# Patient Record
Sex: Female | Born: 1970
Health system: Southern US, Community
[De-identification: ages and names within clinical notes are randomized; demographics above are authoritative.]

## PROBLEM LIST (undated history)

## (undated) DIAGNOSIS — F909 Attention-deficit hyperactivity disorder, unspecified type: Secondary | ICD-10-CM

## (undated) DIAGNOSIS — E785 Hyperlipidemia, unspecified: Secondary | ICD-10-CM

## (undated) HISTORY — DX: Hyperlipidemia, unspecified: E78.5

## (undated) HISTORY — DX: Attention-deficit hyperactivity disorder, unspecified type: F90.9

## (undated) HISTORY — PX: OTHER SURGICAL HISTORY: SHX169

## (undated) HISTORY — PX: HAND SURGERY: SHX662

---

## 1999-06-02 ENCOUNTER — Other Ambulatory Visit: Admission: RE | Admit: 1999-06-02 | Discharge: 1999-06-02 | Payer: Self-pay | Admitting: Obstetrics and Gynecology

## 2000-06-22 ENCOUNTER — Other Ambulatory Visit: Admission: RE | Admit: 2000-06-22 | Discharge: 2000-06-22 | Payer: Self-pay | Admitting: Obstetrics and Gynecology

## 2001-07-03 ENCOUNTER — Other Ambulatory Visit: Admission: RE | Admit: 2001-07-03 | Discharge: 2001-07-03 | Payer: Self-pay | Admitting: Gynecology

## 2002-07-11 ENCOUNTER — Other Ambulatory Visit: Admission: RE | Admit: 2002-07-11 | Discharge: 2002-07-11 | Payer: Self-pay | Admitting: Gynecology

## 2003-12-04 ENCOUNTER — Other Ambulatory Visit: Admission: RE | Admit: 2003-12-04 | Discharge: 2003-12-04 | Payer: Self-pay | Admitting: Gynecology

## 2004-12-29 ENCOUNTER — Other Ambulatory Visit: Admission: RE | Admit: 2004-12-29 | Discharge: 2004-12-29 | Payer: Self-pay | Admitting: Gynecology

## 2007-01-05 ENCOUNTER — Other Ambulatory Visit: Admission: RE | Admit: 2007-01-05 | Discharge: 2007-01-05 | Payer: Self-pay | Admitting: Gynecology

## 2007-01-31 ENCOUNTER — Encounter: Admission: RE | Admit: 2007-01-31 | Discharge: 2007-01-31 | Payer: Self-pay | Admitting: Gynecology

## 2009-10-05 ENCOUNTER — Ambulatory Visit: Payer: Self-pay | Admitting: Diagnostic Radiology

## 2009-10-05 ENCOUNTER — Inpatient Hospital Stay (HOSPITAL_COMMUNITY): Admission: EM | Admit: 2009-10-05 | Discharge: 2009-10-07 | Payer: Self-pay | Admitting: Internal Medicine

## 2009-10-05 ENCOUNTER — Encounter: Payer: Self-pay | Admitting: Emergency Medicine

## 2010-07-05 LAB — COMPREHENSIVE METABOLIC PANEL
ALT: 9 U/L (ref 0–35)
AST: 31 U/L (ref 0–37)
Albumin: 2.9 g/dL — ABNORMAL LOW (ref 3.5–5.2)
Alkaline Phosphatase: 46 U/L (ref 39–117)
BUN: 8 mg/dL (ref 6–23)
Calcium: 8.3 mg/dL — ABNORMAL LOW (ref 8.4–10.5)
Calcium: 9.1 mg/dL (ref 8.4–10.5)
Chloride: 106 mEq/L (ref 96–112)
Creatinine, Ser: 0.55 mg/dL (ref 0.4–1.2)
GFR calc Af Amer: >60 mL/min (ref >60)
Glucose, Bld: 89 mg/dL (ref 70–99)
Potassium: 5 mEq/L (ref 3.5–5.1)
Sodium: 137 mEq/L (ref 135–145)
Sodium: 139 mEq/L (ref 135–145)
Total Bilirubin: 0.9 mg/dL (ref 0.3–1.2)
Total Protein: 5.4 g/dL — ABNORMAL LOW (ref 6.0–8.3)

## 2010-07-05 LAB — HEPATIC FUNCTION PANEL
ALT: 10 U/L (ref 0–35)
Albumin: 2.8 g/dL — ABNORMAL LOW (ref 3.5–5.2)
Alkaline Phosphatase: 45 U/L (ref 39–117)
Bilirubin, Direct: 0.1 mg/dL (ref 0.0–0.3)
Indirect Bilirubin: 0.9 mg/dL (ref 0.3–0.9)
Total Bilirubin: 1 mg/dL (ref 0.3–1.2)
Total Protein: 5.3 g/dL — ABNORMAL LOW (ref 6.0–8.3)

## 2010-07-05 LAB — DIFFERENTIAL
Eosinophils Relative: 0 % (ref 0–5)
Lymphs Abs: 1 10*3/uL (ref 0.7–4.0)
Monocytes Absolute: 0.7 10*3/uL (ref 0.1–1.0)
Neutro Abs: 8.6 10*3/uL — ABNORMAL HIGH (ref 1.7–7.7)

## 2010-07-05 LAB — CBC
HCT: 36.2 % (ref 36.0–46.0)
Hemoglobin: 12.4 g/dL (ref 12.0–15.0)
MCHC: 34.3 g/dL (ref 30.0–36.0)
MCV: 93.5 fL (ref 78.0–100.0)
Platelets: 216 10*3/uL (ref 150–400)

## 2010-07-05 LAB — LIPID PANEL
Triglycerides: 155 mg/dL — ABNORMAL HIGH (ref <150)
VLDL: 31 mg/dL (ref 0–40)

## 2010-07-05 LAB — URINALYSIS, ROUTINE W REFLEX MICROSCOPIC
Glucose, UA: NEGATIVE mg/dL
Protein, ur: NEGATIVE mg/dL
Urobilinogen, UA: 0.2 mg/dL (ref 0.0–1.0)
pH: 5.5 (ref 5.0–8.0)

## 2010-07-05 LAB — LIPASE, BLOOD
Lipase: 954 U/L — ABNORMAL HIGH (ref 23–300)
Lipase: 97 U/L — ABNORMAL HIGH (ref 11–59)
Lipase: 99 U/L — ABNORMAL HIGH (ref 11–59)

## 2012-05-03 ENCOUNTER — Other Ambulatory Visit: Payer: Self-pay | Admitting: Gynecology

## 2012-05-03 DIAGNOSIS — Z1231 Encounter for screening mammogram for malignant neoplasm of breast: Secondary | ICD-10-CM

## 2012-05-22 ENCOUNTER — Ambulatory Visit
Admission: RE | Admit: 2012-05-22 | Discharge: 2012-05-22 | Disposition: A | Discharge status: Home / Self Care | Payer: Managed Care, Other (non HMO) | Source: Ambulatory Visit | Attending: Gynecology | Admitting: Gynecology

## 2012-05-22 DIAGNOSIS — Z1231 Encounter for screening mammogram for malignant neoplasm of breast: Secondary | ICD-10-CM

## 2013-05-25 ENCOUNTER — Other Ambulatory Visit: Payer: Self-pay

## 2013-05-25 DIAGNOSIS — Z1231 Encounter for screening mammogram for malignant neoplasm of breast: Secondary | ICD-10-CM

## 2013-06-22 ENCOUNTER — Ambulatory Visit
Admission: RE | Admit: 2013-06-22 | Discharge: 2013-06-22 | Disposition: A | Discharge status: Home / Self Care | Payer: Managed Care, Other (non HMO) | Source: Ambulatory Visit

## 2013-06-22 DIAGNOSIS — Z1231 Encounter for screening mammogram for malignant neoplasm of breast: Secondary | ICD-10-CM

## 2014-07-03 ENCOUNTER — Other Ambulatory Visit: Payer: Self-pay

## 2014-07-03 DIAGNOSIS — Z1231 Encounter for screening mammogram for malignant neoplasm of breast: Secondary | ICD-10-CM

## 2014-07-24 ENCOUNTER — Ambulatory Visit
Admission: RE | Admit: 2014-07-24 | Discharge: 2014-07-24 | Disposition: A | Discharge status: Home / Self Care | Payer: BLUE CROSS/BLUE SHIELD | Source: Ambulatory Visit

## 2014-07-24 DIAGNOSIS — Z1231 Encounter for screening mammogram for malignant neoplasm of breast: Secondary | ICD-10-CM

## 2015-02-03 ENCOUNTER — Emergency Department (HOSPITAL_COMMUNITY): Payer: BLUE CROSS/BLUE SHIELD

## 2015-02-03 ENCOUNTER — Emergency Department (HOSPITAL_BASED_OUTPATIENT_CLINIC_OR_DEPARTMENT_OTHER): Payer: BLUE CROSS/BLUE SHIELD

## 2015-02-03 ENCOUNTER — Inpatient Hospital Stay (HOSPITAL_BASED_OUTPATIENT_CLINIC_OR_DEPARTMENT_OTHER)
Admission: EM | Admit: 2015-02-03 | Discharge: 2015-02-05 | DRG: 201 | Disposition: A | Discharge status: Home / Self Care | Payer: BLUE CROSS/BLUE SHIELD | Attending: Internal Medicine | Admitting: Internal Medicine

## 2015-02-03 ENCOUNTER — Encounter (HOSPITAL_BASED_OUTPATIENT_CLINIC_OR_DEPARTMENT_OTHER): Payer: Self-pay

## 2015-02-03 DIAGNOSIS — J939 Pneumothorax, unspecified: Secondary | ICD-10-CM

## 2015-02-03 DIAGNOSIS — Z720 Tobacco use: Secondary | ICD-10-CM | POA: Diagnosis not present

## 2015-02-03 DIAGNOSIS — J982 Interstitial emphysema: Secondary | ICD-10-CM | POA: Diagnosis present

## 2015-02-03 DIAGNOSIS — J9311 Primary spontaneous pneumothorax: Secondary | ICD-10-CM

## 2015-02-03 DIAGNOSIS — Z9689 Presence of other specified functional implants: Secondary | ICD-10-CM

## 2015-02-03 DIAGNOSIS — J9383 Other pneumothorax: Principal | ICD-10-CM | POA: Diagnosis present

## 2015-02-03 DIAGNOSIS — R110 Nausea: Secondary | ICD-10-CM | POA: Diagnosis present

## 2015-02-03 DIAGNOSIS — D649 Anemia, unspecified: Secondary | ICD-10-CM | POA: Diagnosis present

## 2015-02-03 LAB — URINALYSIS, ROUTINE W REFLEX MICROSCOPIC
BILIRUBIN URINE: NEGATIVE
GLUCOSE, UA: NEGATIVE mg/dL
HGB URINE DIPSTICK: NEGATIVE
Ketones, ur: NEGATIVE mg/dL
Leukocytes, UA: NEGATIVE
Nitrite: NEGATIVE
Protein, ur: NEGATIVE mg/dL
SPECIFIC GRAVITY, URINE: 1.027 (ref 1.005–1.030)
UROBILINOGEN UA: 0.2 mg/dL (ref 0.0–1.0)
pH: 6 (ref 5.0–8.0)

## 2015-02-03 LAB — PREGNANCY, URINE: PREG TEST UR: NEGATIVE

## 2015-02-03 LAB — DIFFERENTIAL
BASOS ABS: 0 10*3/uL (ref 0.0–0.1)
BASOS PCT: 0 %
EOS ABS: 0.4 10*3/uL (ref 0.0–0.7)
Eosinophils Relative: 6 %
Lymphocytes Relative: 31 %
Lymphs Abs: 2.2 10*3/uL (ref 0.7–4.0)
Monocytes Absolute: 0.6 10*3/uL (ref 0.1–1.0)
Monocytes Relative: 9 %
NEUTROS ABS: 3.9 10*3/uL (ref 1.7–7.7)
NEUTROS PCT: 54 %

## 2015-02-03 LAB — COMPREHENSIVE METABOLIC PANEL
ALK PHOS: 42 U/L (ref 38–126)
ALT: 15 U/L (ref 14–54)
AST: 19 U/L (ref 15–41)
Albumin: 4.3 g/dL (ref 3.5–5.0)
Anion gap: 3 — ABNORMAL LOW (ref 5–15)
BILIRUBIN TOTAL: 0.4 mg/dL (ref 0.3–1.2)
BUN: 15 mg/dL (ref 6–20)
CALCIUM: 9.1 mg/dL (ref 8.9–10.3)
CO2: 28 mmol/L (ref 22–32)
CREATININE: 0.66 mg/dL (ref 0.44–1.00)
Chloride: 107 mmol/L (ref 101–111)
GFR calc Af Amer: >60 mL/min (ref >60)
Glucose, Bld: 97 mg/dL (ref 65–99)
POTASSIUM: 4.7 mmol/L (ref 3.5–5.1)
Sodium: 138 mmol/L (ref 135–145)
TOTAL PROTEIN: 7.4 g/dL (ref 6.5–8.1)

## 2015-02-03 LAB — LIPASE, BLOOD: Lipase: 23 U/L (ref 22–51)

## 2015-02-03 LAB — CBC
HCT: 35.4 % — ABNORMAL LOW (ref 36.0–46.0)
Hemoglobin: 11.5 g/dL — ABNORMAL LOW (ref 12.0–15.0)
MCH: 28 pg (ref 26.0–34.0)
MCHC: 32.5 g/dL (ref 30.0–36.0)
MCV: 86.3 fL (ref 78.0–100.0)
PLATELETS: 247 10*3/uL (ref 150–400)
RBC: 4.1 MIL/uL (ref 3.87–5.11)
RDW: 13.5 % (ref 11.5–15.5)
WBC: 7.1 10*3/uL (ref 4.0–10.5)

## 2015-02-03 MED ORDER — MIDAZOLAM HCL 2 MG/2ML IJ SOLN
1.0000 mg | Freq: Once | INTRAMUSCULAR | Status: DC
  Filled 2015-02-03: qty 2

## 2015-02-03 MED ORDER — MORPHINE SULFATE (PF) 4 MG/ML IV SOLN
4.0000 mg | Freq: Once | INTRAVENOUS | Status: AC
  Administered 2015-02-03: 4 mg via INTRAVENOUS
  Filled 2015-02-03: qty 1

## 2015-02-03 MED ORDER — HYDROMORPHONE HCL 1 MG/ML IJ SOLN
0.5000 mg | Freq: Once | INTRAMUSCULAR | Status: AC
  Administered 2015-02-03: 0.5 mg via INTRAVENOUS
  Filled 2015-02-03: qty 1

## 2015-02-03 MED ORDER — HYDROMORPHONE HCL 1 MG/ML IJ SOLN
INTRAMUSCULAR | Status: AC
  Filled 2015-02-03: qty 1

## 2015-02-03 MED ORDER — HYDROMORPHONE HCL 1 MG/ML IJ SOLN: 1.0000 mg | Freq: Once | INTRAMUSCULAR | Status: DC

## 2015-02-03 MED ORDER — HYDROMORPHONE HCL 1 MG/ML IJ SOLN
1.0000 mg | Freq: Once | INTRAMUSCULAR | Status: AC
  Administered 2015-02-03: 1 mg via INTRAVENOUS
  Filled 2015-02-03: qty 1

## 2015-02-03 MED ORDER — SODIUM CHLORIDE 0.9 % IV BOLUS (SEPSIS)
1000.0000 mL | Freq: Once | INTRAVENOUS | Status: AC
  Administered 2015-02-03: 1000 mL via INTRAVENOUS

## 2015-02-03 MED ORDER — HEPARIN SODIUM (PORCINE) 5000 UNIT/ML IJ SOLN
5000.0000 [IU] | Freq: Three times a day (TID) | INTRAMUSCULAR | Status: DC
  Administered 2015-02-04 – 2015-02-05 (×6): 5000 [IU] via SUBCUTANEOUS
  Filled 2015-02-03 (×6): qty 1

## 2015-02-03 MED ORDER — LIDOCAINE-EPINEPHRINE (PF) 2 %-1:200000 IJ SOLN
10.0000 mL | Freq: Once | INTRAMUSCULAR | Status: AC
  Administered 2015-02-03: 10 mL via INTRADERMAL
  Filled 2015-02-03: qty 20

## 2015-02-03 MED ORDER — MIDAZOLAM HCL 2 MG/2ML IJ SOLN
1.0000 mg | Freq: Once | INTRAMUSCULAR | Status: AC
  Administered 2015-02-03: 1 mg via INTRAVENOUS

## 2015-02-03 MED ORDER — NICOTINE 21 MG/24HR TD PT24
21.0000 mg | MEDICATED_PATCH | Freq: Once | TRANSDERMAL | Status: AC
  Administered 2015-02-03: 21 mg via TRANSDERMAL
  Filled 2015-02-03: qty 1

## 2015-02-03 MED ORDER — HYDROMORPHONE HCL 1 MG/ML IJ SOLN
0.5000 mg | Freq: Once | INTRAMUSCULAR | Status: AC
Start: 2015-02-03 — End: 2015-02-03
  Administered 2015-02-03: 0.5 mg via INTRAVENOUS
  Filled 2015-02-03: qty 1

## 2015-02-03 MED ORDER — SODIUM CHLORIDE 0.9 % IV SOLN: 1000.0000 mL | INTRAVENOUS | Status: DC

## 2015-02-03 MED ORDER — MIDAZOLAM HCL 2 MG/2ML IJ SOLN
INTRAMUSCULAR | Status: AC
  Filled 2015-02-03: qty 2

## 2015-02-03 MED ORDER — HYDROMORPHONE HCL 1 MG/ML IJ SOLN
1.0000 mg | Freq: Once | INTRAMUSCULAR | Status: AC
  Administered 2015-02-03: 1 mg via INTRAVENOUS

## 2015-02-03 MED ORDER — SODIUM CHLORIDE 0.9 % IV SOLN
1000.0000 mL | Freq: Once | INTRAVENOUS | Status: AC
  Administered 2015-02-03: 1000 mL via INTRAVENOUS

## 2015-02-03 MED ORDER — HYDROCODONE-ACETAMINOPHEN 5-325 MG PO TABS
1.0000 | ORAL_TABLET | ORAL | Status: DC | PRN
  Administered 2015-02-04: 2 via ORAL
  Filled 2015-02-03: qty 2

## 2015-02-03 NOTE — Consult Note (Signed)
[address]       ,[place] [address]             [phone]        Natalie Jones [place] Medical Record #[id number] Date of Birth: 12-11-1970  Referring: No ref. provider found Primary Care: No PCP Per Patient  Chief Complaint:    Chief Complaint  Patient presents with  . Abdominal Pain    History of Present Illness:      Patient is  is a 44 y.o. female who presents to the Emergency Department complaining of 3 days of  , constant LUQ abdominal pain that radiates to left flank region.. Today, she reports the pain has returned. Pt additionally reports the pain to radiate to her bilateral upper back. Bending over exacerbates her pain. She has alternated the application of ice and a heating pad with some relief. Her pain has been unrelieved by ibuprofen. She has a PMhx of abdominal pain s/p EtOH consumption that she was admitted for but pt denies her current pain to have a similar onset or similar qualities. Pt denies nausea, vomiting, constipation, diarrhea, fevers, chills or diaphoresis Pt is long term smoker. Chest xray showed apical 20 ptx. While I was in OR,  ER MD placed pigtail left chest but xray suspicious the it is in muscle and not intra thoracic.  Current Activity/ Functional Status: Patient is independent with mobility/ambulation, transfers, ADL's, IADL's.   Zubrod Score: At the time of surgery this patient's most appropriate activity status/level should be described as: []     0    Normal activity, no symptoms [x]     1    Restricted in physical strenuous activity but ambulatory, able to do out light work []     2    Ambulatory and capable of self care, unable to do work activities, up and about                 more than 50%  Of the time                            []     3    Only limited self care, in bed greater than 50% of waking hours []     4    Completely disabled, no self care, confined to bed or chair []     5    Moribund  History reviewed. No  pertinent past medical history.  Past Surgical History  Procedure Laterality Date  . Hand surgery      History  Smoking status  . Never Smoker   Smokeless tobacco  . Not on file   History  Alcohol Use  . Yes    Comment: occ    Social History   Social History  . Marital Status: Single    Spouse Name: N/A  . Number of Children: N/A  . Years of Education: N/A   Occupational History  . Not on file.   Social History Main Topics  . Smoking status: Never Smoker   . Smokeless tobacco: Not on file  . Alcohol Use: Yes     Comment: occ  . Drug Use: No  . Sexual Activity: Not on file   Other Topics Concern  . Not on file   Social History Narrative  . No narrative on file    No Known Allergies  Current Facility-Administered Medications  Medication Dose Route Frequency  Provider Last Rate Last Dose  . midazolam (VERSED) injection 1 mg  1 mg Intravenous Once Roberto Scales, MD      . nicotine (NICODERM CQ - dosed in mg/24 hours) patch 21 mg  21 mg Transdermal Once Roberto Scales, MD   21 mg at 02/03/15 2123   Current Outpatient Prescriptions  Medication Sig Dispense Refill  . ibuprofen (ADVIL,MOTRIN) 800 MG tablet Take 800 mg by mouth daily as needed for moderate pain.        (Not in a hospital admission)  No family history on file.   Review of Systems:      Cardiac Review of Systems: Y or N  Chest Pain [  left  ]  Resting SOB [ n  ] Exertional SOB  [ y ]  Orthopnea [ n ]   Pedal Edema [ n  ]    Palpitations [ n ] Syncope  n[  ]   Presyncope [ n  ]  General Review of Systems: [Y] = yes [  ]=no Constitional: recent weight change [  ]; anorexia [  ]; fatigue [  ]; nausea [  ]; night sweats [  ]; fever [  ]; or chills [  ]                                                               Dental: poor dentition[  ]; Last Dentist visit:   Eye : blurred vision [  ]; diplopia [   ]; vision changes [  ];  Amaurosis fugax[  ]; Resp: cough [  ];  wheezing[  ];  hemoptysis[   ]; shortness of breath[  ]; paroxysmal nocturnal dyspnea[  ]; dyspnea on exertion[  ]; or orthopnea[  ];  GI:  gallstones[  ], vomiting[  ];  dysphagia[  ]; melena[  ];  hematochezia [  ]; heartburn[  ];   Hx of  Colonoscopy[  ]; GU: kidney stones [  ]; hematuria[  ];   dysuria [  ];  nocturia[  ];  history of     obstruction [  ]; urinary frequency [  ]             Skin: rash, swelling[  ];, hair loss[  ];  peripheral edema[  ];  or itching[  ]; Musculosketetal: myalgias[  ];  joint swelling[  ];  joint erythema[  ];  joint pain[  ];  back pain[  ];  Heme/Lymph: bruising[  ];  bleeding[  ];  anemia[  ];  Neuro: TIA[  ];  headaches[  ];  stroke[  ];  vertigo[  ];  seizures[  ];   paresthesias[  ];  difficulty walking[  ];  Psych:depression[  ]; anxiety[  ];  Endocrine: diabetes[  ];  thyroid dysfunction[  ];  Immunizations: Flu [  ]; Pneumococcal[  ];  Other:  Physical Exam: BP 110/58 mmHg  Pulse 88  Temp(Src) 98.5 F (36.9 C) (Oral)  Resp 16  Ht 5\' 2"  (1.575 m)  Wt 102 lb (46.267 kg)  BMI 18.65 kg/m2  SpO2 100%  LMP 01/18/2015   General appearance: alert, cooperative and no distress Head: Normocephalic, without obvious abnormality, atraumatic Neck: no adenopathy, no carotid bruit, no JVD, supple, symmetrical, trachea  midline and thyroid not enlarged, symmetric, no tenderness/mass/nodules Lymph nodes: Cervical, supraclavicular, and axillary nodes normal. Resp: diminished breath sounds LUL Back: symmetric, no curvature. ROM normal. No CVA tenderness. Cardio: regular rate and rhythm, S1, S2 normal, no murmur, click, rub or gallop GI: soft, non-tender; bowel sounds normal; no masses,  no organomegaly Extremities: extremities normal, atraumatic, no cyanosis or edema and Homans sign is negative, no sign of DVT Neurologic: Grossly normal  Diagnostic Studies & Laboratory data:     Recent Radiology Findings:   Dg Chest 2 View  02/03/2015  CLINICAL DATA:  Left abdominal and upper  back pain. EXAM: CHEST  2 VIEW COMPARISON:  02/21/2006 chest radiograph FINDINGS: There is a moderate to large left pneumothorax. No right pneumothorax. No pleural effusion. There is minimal right right mediastinal shift. Normal heart size. Normal mediastinal contour. Lungs appear clear, with no pulmonary edema. No displaced fracture is seen in the chest. IMPRESSION: Moderate to large left pneumothorax with minimal right mediastinal shift, suggesting tension pneumothorax. Lungs appear clear. Critical Value/emergent results were called by telephone at the time of interpretation on 02/03/2015 at 4:32 pm to Dr. Delora Fuel , who verbally acknowledged these results. Electronically Signed   By: Ilona Sorrel M.D.   On: 02/03/2015 16:34   Dg Chest Portable 1 View  02/03/2015  CLINICAL DATA:  Left pneumothorax post left chest tube placement , query intrathoracic location of the catheter. EXAM: PORTABLE CHEST 1 VIEW COMPARISON:  Earlier this day at 2238 hour FINDINGS: Portable semi upright oblique view of the thorax demonstrates left-sided catheter projecting over the left hemithorax, however chest wall versus intrathoracic location could not be determined. The left pneumothorax persists. IMPRESSION: Pigtail catheter in the left hemithorax, however intrathoracic placement cannot be confirmed. Recommend repositioning. Persistent left pneumothorax. These results were discussed via telephone at the time of interpretation on 02/03/2015 at 11:00 pm with Dr. Debby Freiberg , who verbally acknowledged these results. Electronically Signed   By: Jeb Levering M.D.   On: 02/03/2015 23:04   Dg Chest Portable 1 View  02/03/2015  CLINICAL DATA:  Left pneumothorax, post left chest tube insertion. EXAM: PORTABLE CHEST 1 VIEW COMPARISON:  Pre chest tube exam earlier this day at 1623 hour FINDINGS: Placement of left-sided pigtail catheter, tip appears coiled projecting over the lateral left fifth rib. Left pneumothorax persists,  with equivocal decrease in size. No mediastinal shift. The right lung is clear. Minimal atelectasis at the medial left lung base. No edema. IMPRESSION: Placement of left chest pigtail catheter, tip coiled over the lateral hemithorax projecting over the lateral ribs. Persistent left pneumothorax with equivocal decreased in size. It is unclear whether the catheter is intrathoracic or in the soft tissues, and additional oblique views have been performed and are pending at this time. These results were discussed by telephone at the time of the exam on 02/03/2015 at approximately 10:50 pm with Dr. Debby Freiberg , who verbally acknowledged these results. Electronically Signed   By: Jeb Levering M.D.   On: 02/03/2015 22:57     I have independently reviewed the above radiologic studies.  Recent Lab Findings: Lab Results  Component Value Date   WBC 7.1 02/03/2015   HGB 11.5* 02/03/2015   HCT 35.4* 02/03/2015   PLT 247 02/03/2015   GLUCOSE 97 02/03/2015   CHOL  10/06/2009    133        ATP III CLASSIFICATION:  <200     mg/dL   Desirable  200-239  mg/dL   Borderline High  >=240    mg/dL   High          TRIG 155* 10/06/2009   HDL 46 10/06/2009   LDLCALC  10/06/2009    56        Total Cholesterol/HDL:CHD Risk Coronary Heart Disease Risk Table                     Men   Women  1/2 Average Risk   3.4   3.3  Average Risk       5.0   4.4  2 X Average Risk   9.6   7.1  3 X Average Risk  23.4   11.0        Use the calculated Patient Ratio above and the CHD Risk Table to determine the patient's CHD Risk.        ATP III CLASSIFICATION (LDL):  <100     mg/dL   Optimal  100-129  mg/dL   Near or Above                    Optimal  130-159  mg/dL   Borderline  160-189  mg/dL   High  >190     mg/dL   Very High   ALT 15 02/03/2015   AST 19 02/03/2015   NA 138 02/03/2015   K 4.7 02/03/2015   CL 107 02/03/2015   CREATININE 0.66 02/03/2015   BUN 15 02/03/2015   CO2 28 02/03/2015       Assessment / Plan:     Spontaneous ptx left with several days of abdominal , flank pain. After reviewing the films I have recommended removal of pig tail which is not functional. Follow up xray in am, if remains stable may treat without chest tube.     Grace Isaac MD      [address] [place] [address] Office [phone]   Beeper [phone]  02/03/2015 11:44 PM

## 2015-02-03 NOTE — ED Notes (Signed)
Pt care transferred to [facility] nurses at bedside. Pt requests pain medication, rates her flank pain at 6/10. Pt is awake and alert, talking to her family on cell phone, aware of pending transport to [place] for eval of pneumo.

## 2015-02-03 NOTE — ED Notes (Signed)
MD at bedside. 

## 2015-02-03 NOTE — ED Provider Notes (Addendum)
CSN: [id number]     Arrival date & time 02/03/15  1320 History  By signing my name below, I, Meriel Pica, attest that this documentation has been prepared under the direction and in the presence of Delora Fuel, MD. Electronically Signed: Meriel Pica, ED Scribe. 02/03/2015. 3:39 PM.   Chief Complaint  Patient presents with  . Abdominal Pain   The history is provided by the patient. No language interpreter was used.   HPI Comments: Natalie Jones is a 44 y.o. female who presents to the Emergency Department complaining of sudden onset, constant LUQ abdominal pain that radiates to left flank region. Pt reports the pain originally began 2 days ago after having a BM but subsided on Sunday. Today, she reports the pain has returned and is a constant, 8/10, throbbing pain. Pt additionally reports the pain to radiate to her bilateral upper back. Bending over exacerbates her pain. She has alternated the application of ice and a heating pad with some relief. Her pain has been unrelieved by ibuprofen. She has a PMhx of abdominal pain s/p EtOH consumption that she was admitted for but pt denies her current pain to have a similar onset or similar qualities. Pt denies nausea, vomiting, constipation, diarrhea, fevers, chills or diaphoresis.   History reviewed. No pertinent past medical history. Past Surgical History  Procedure Laterality Date  . Hand surgery     No family history on file. Social History  Substance Use Topics  . Smoking status: Never Smoker   . Smokeless tobacco: None  . Alcohol Use: Yes     Comment: occ   OB History    No data available     Review of Systems  Constitutional: Negative for fever, chills and diaphoresis.  Gastrointestinal: Positive for abdominal pain ( LUQ). Negative for nausea, vomiting, diarrhea and constipation.  Genitourinary: Positive for flank pain ( left).  Musculoskeletal: Positive for back pain ( bilateral upper back).  All other systems reviewed and are  negative.  Allergies  Review of patient's allergies indicates no known allergies.  Home Medications   Prior to Admission medications   Medication Sig Start Date End Date Taking? Authorizing Provider  Amphetamine-Dextroamphetamine (ADDERALL PO) Take by mouth.   Yes Historical Provider, MD   BP 116/51 mmHg  Pulse 103  Temp(Src) 98.5 F (36.9 C) (Oral)  Resp 18  Ht 5\' 2"  (1.575 m)  Wt 102 lb (46.267 kg)  BMI 18.65 kg/m2  SpO2 99%  LMP 01/18/2015 Physical Exam  Constitutional: She is oriented to person, place, and time. She appears well-developed and well-nourished.  HENT:  Head: Normocephalic and atraumatic.  Eyes: EOM are normal. Pupils are equal, round, and reactive to light.  Neck: Normal range of motion. Neck supple. No JVD present.  Cardiovascular: Normal rate, regular rhythm and normal heart sounds.   No murmur heard. Pulmonary/Chest: Effort normal and breath sounds normal. She has no wheezes. She has no rales.  Abdominal: Soft. Bowel sounds are normal. She exhibits no distension and no mass. There is tenderness. There is no rebound and no guarding.  Mild tenderness to epigastrium and LUQ , mild CVA tenderness, no rebound no guarding.   Musculoskeletal: Normal range of motion. She exhibits no edema.  Lymphadenopathy:    She has no cervical adenopathy.  Neurological: She is alert and oriented to person, place, and time. No cranial nerve deficit. She exhibits normal muscle tone. Coordination normal.  Skin: Skin is warm and dry. No rash noted.  Psychiatric: She has a  normal mood and affect. Her behavior is normal. Judgment and thought content normal.  Nursing note and vitals reviewed.   ED Course  Procedures  DIAGNOSTIC STUDIES: Oxygen Saturation is 99% on RA, normal by my interpretation.    COORDINATION OF CARE: 3:51 PM Discussed treatment plan with pt at bedside and pt agreed to plan. Will order diagnostic imaging and labs.    Labs Review Results for orders placed  or performed during the hospital encounter of 02/03/15  Urinalysis, Routine w reflex microscopic (not at [facility])  Result Value Ref Range   Color, Urine YELLOW YELLOW   APPearance CLOUDY (A) CLEAR   Specific Gravity, Urine 1.027 1.005 - 1.030   pH 6.0 5.0 - 8.0   Glucose, UA NEGATIVE NEGATIVE mg/dL   Hgb urine dipstick NEGATIVE NEGATIVE   Bilirubin Urine NEGATIVE NEGATIVE   Ketones, ur NEGATIVE NEGATIVE mg/dL   Protein, ur NEGATIVE NEGATIVE mg/dL   Urobilinogen, UA 0.2 0.0 - 1.0 mg/dL   Nitrite NEGATIVE NEGATIVE   Leukocytes, UA NEGATIVE NEGATIVE  Pregnancy, urine  Result Value Ref Range   Preg Test, Ur NEGATIVE NEGATIVE  Lipase, blood  Result Value Ref Range   Lipase 23 22 - 51 U/L  Comprehensive metabolic panel  Result Value Ref Range   Sodium 138 135 - 145 mmol/L   Potassium 4.7 3.5 - 5.1 mmol/L   Chloride 107 101 - 111 mmol/L   CO2 28 22 - 32 mmol/L   Glucose, Bld 97 65 - 99 mg/dL   BUN 15 6 - 20 mg/dL   Creatinine, Ser 0.66 0.44 - 1.00 mg/dL   Calcium 9.1 8.9 - 10.3 mg/dL   Total Protein 7.4 6.5 - 8.1 g/dL   Albumin 4.3 3.5 - 5.0 g/dL   AST 19 15 - 41 U/L   ALT 15 14 - 54 U/L   Alkaline Phosphatase 42 38 - 126 U/L   Total Bilirubin 0.4 0.3 - 1.2 mg/dL   GFR calc non Af Amer >60 >60 mL/min   GFR calc Af Amer >60 >60 mL/min   Anion gap 3 (L) 5 - 15  CBC  Result Value Ref Range   WBC 7.1 4.0 - 10.5 K/uL   RBC 4.10 3.87 - 5.11 MIL/uL   Hemoglobin 11.5 (L) 12.0 - 15.0 g/dL   HCT 35.4 (L) 36.0 - 46.0 %   MCV 86.3 78.0 - 100.0 fL   MCH 28.0 26.0 - 34.0 pg   MCHC 32.5 30.0 - 36.0 g/dL   RDW 13.5 11.5 - 15.5 %   Platelets 247 150 - 400 K/uL  Differential  Result Value Ref Range   Neutrophils Relative % 54 %   Neutro Abs 3.9 1.7 - 7.7 K/uL   Lymphocytes Relative 31 %   Lymphs Abs 2.2 0.7 - 4.0 K/uL   Monocytes Relative 9 %   Monocytes Absolute 0.6 0.1 - 1.0 K/uL   Eosinophils Relative 6 %   Eosinophils Absolute 0.4 0.0 - 0.7 K/uL   Basophils Relative 0 %    Basophils Absolute 0.0 0.0 - 0.1 K/uL   Imaging Review Dg Chest 2 View  02/03/2015  CLINICAL DATA:  Left abdominal and upper back pain. EXAM: CHEST  2 VIEW COMPARISON:  02/21/2006 chest radiograph FINDINGS: There is a moderate to large left pneumothorax. No right pneumothorax. No pleural effusion. There is minimal right right mediastinal shift. Normal heart size. Normal mediastinal contour. Lungs appear clear, with no pulmonary edema. No displaced fracture is seen in the  chest. IMPRESSION: Moderate to large left pneumothorax with minimal right mediastinal shift, suggesting tension pneumothorax. Lungs appear clear. Critical Value/emergent results were called by telephone at the time of interpretation on 02/03/2015 at 4:32 pm to Dr. Delora Fuel , who verbally acknowledged these results. Electronically Signed   By: Ilona Sorrel M.D.   On: 02/03/2015 16:34   I have personally reviewed and evaluated these images and lab results as part of my medical decision-making.   MDM   Final diagnoses:  Spontaneous pneumothorax  Normochromic normocytic anemia    Abdominal pain of uncertain cause. Old records are reviewed and she had been admitted for pancreatitis 5 years ago. That was after drinking a large amount of active. Patient denies any recent alcohol ingestion. CT of abdomen and pelvis was ordered. She is complaining of pain going up into her interscapular area, so chest x-ray was ordered as well. Patient refused the CT scan but chest x-ray did show a spontaneous pneumothorax. This was discussed with radiologist who was concerned that there was perhaps some slight mid mediastinal shift. I have reviewed the images and do not feel that she shows any signs of tension pneumothorax. It is only approximately 40-50% pneumothorax on the left and blood pressure is normal as his heart rate. Consultation was obtained with Dr. Pia Mau of the thoracic surgery service who states he had just scrubbed in on an emergency  procedure and requested the patient be sent to [facility] ED, and he would see the patient is in his E was completed with the procedure. Case is discussed with Dr. Colin Rhein, ED physician at [facility] ED who agrees to accept the patient in transfer.  I, Carless Slatten, personally performed the services described in this documentation. All medical record entries made by the scribe were at my direction and in my presence.  I have reviewed the chart and discharge instructions and agree that the record reflects my personal performance and is accurate and complete. Eloise Picone.  38/88/2800. 4:48 PM.      Delora Fuel, MD 34/91/79 1505  Makalia Bare, MD 69/79/48 0165

## 2015-02-03 NOTE — ED Provider Notes (Signed)
  Physical Exam  BP 110/58 mmHg  Pulse 88  Temp(Src) 98.5 F (36.9 C) (Oral)  Resp 16  Ht 5\' 2"  (1.575 m)  Wt 102 lb (46.267 kg)  BMI 18.65 kg/m2  SpO2 100%  LMP 01/18/2015  Physical Exam  Constitutional: She is oriented to person, place, and time. She appears well-developed and well-nourished.  HENT:  Head: Normocephalic and atraumatic.  Right Ear: External ear normal.  Left Ear: External ear normal.  Eyes: Conjunctivae and EOM are normal. Pupils are equal, round, and reactive to light.  Neck: Normal range of motion. Neck supple.  Cardiovascular: Normal rate, regular rhythm, normal heart sounds and intact distal pulses.   Pulmonary/Chest: Effort normal. She has decreased breath sounds (L lung, all fields).  Abdominal: Soft. Bowel sounds are normal. There is no tenderness.  Musculoskeletal: Normal range of motion.  Neurological: She is alert and oriented to person, place, and time.  Skin: Skin is warm and dry.  Vitals reviewed.   ED Course  Procedures  MDM 44 y.o. female without pertinent PMH presents with L sided chest pain, spontaneous.  Seen at [facility], found to have moderate to large PTX.  On my assumption of care, exam and vitals as above.  Spoke with CT surgery who recommended we place chest tube here.  This was attempted unsuccessfully, with placement in chest wall.  CT surgery coincidentally came to bedside, examined XR, and do not feel that pt warrants chest tube at this time, so unsuccessful chest tube was removed with plan to repeat xr in am.  CTS requested medical admission.  Admitted in stable condition  I have reviewed all laboratory and imaging studies if ordered as above  1. Spontaneous pneumothorax   2. Normochromic normocytic anemia   3. Pneumothorax   4. Chest tube in place           Debby Freiberg, MD 02/04/15 [phone]

## 2015-02-03 NOTE — ED Notes (Signed)
[facility] at bedside 

## 2015-02-03 NOTE — ED Notes (Signed)
Pt. Arrived from [place] for Thoracic Surgery. Pt. Stable no sob noted. Oxygen level 100% on NRB 100%

## 2015-02-03 NOTE — ED Notes (Addendum)
Pt c/o pain to left abd, rib area, upper back after voiding on Saturday-no pain Sunday-pain returned today-points to left flank for pain c/o at this time-pain worse with movement

## 2015-02-04 ENCOUNTER — Encounter (HOSPITAL_COMMUNITY): Payer: Self-pay | Admitting: *Deleted

## 2015-02-04 ENCOUNTER — Inpatient Hospital Stay (HOSPITAL_COMMUNITY): Payer: BLUE CROSS/BLUE SHIELD

## 2015-02-04 MED ORDER — METOCLOPRAMIDE HCL 5 MG/ML IJ SOLN
10.0000 mg | Freq: Four times a day (QID) | INTRAMUSCULAR | Status: DC
  Administered 2015-02-04 – 2015-02-05 (×5): 10 mg via INTRAVENOUS
  Filled 2015-02-04 (×5): qty 2

## 2015-02-04 MED ORDER — AMPHETAMINE-DEXTROAMPHETAMINE 10 MG PO TABS
20.0000 mg | ORAL_TABLET | ORAL | Status: DC
  Administered 2015-02-04 – 2015-02-05 (×3): 20 mg via ORAL
  Filled 2015-02-04 (×3): qty 2

## 2015-02-04 MED ORDER — OXYCODONE HCL 5 MG PO TABS
5.0000 mg | ORAL_TABLET | ORAL | Status: DC | PRN
  Administered 2015-02-04: 5 mg via ORAL
  Administered 2015-02-04: 10 mg via ORAL
  Administered 2015-02-04: 5 mg via ORAL
  Administered 2015-02-05: 10 mg via ORAL
  Filled 2015-02-04: qty 1
  Filled 2015-02-04: qty 2
  Filled 2015-02-04: qty 1
  Filled 2015-02-04: qty 2

## 2015-02-04 MED ORDER — ONDANSETRON HCL 4 MG/2ML IJ SOLN
4.0000 mg | Freq: Four times a day (QID) | INTRAMUSCULAR | Status: DC | PRN
  Administered 2015-02-04 (×2): 4 mg via INTRAVENOUS
  Filled 2015-02-04 (×2): qty 2

## 2015-02-04 MED ORDER — NICOTINE 21 MG/24HR TD PT24
21.0000 mg | MEDICATED_PATCH | Freq: Every day | TRANSDERMAL | Status: DC
  Administered 2015-02-04 – 2015-02-05 (×2): 21 mg via TRANSDERMAL
  Filled 2015-02-04 (×3): qty 1

## 2015-02-04 NOTE — Progress Notes (Addendum)
      [address]       [place] [address]             [phone]      Subjective:  Ms. Azucena Kuba complains at pain at her left chest.  She states the pain medication is not helping.  She is also experiencing nausea.  Objective: Vital signs in last 24 hours: Temp:  [97.7 F (36.5 C)-98.5 F (36.9 C)] 97.7 F (36.5 C) (10/18 0113) Pulse Rate:  [69-103] 88 (10/18 0113) Cardiac Rhythm:  [-] Normal sinus rhythm (10/18 0845) Resp:  [15-26] 18 (10/18 0113) BP: (99-118)/(51-69) 99/52 mmHg (10/18 0113) SpO2:  [97 %-100 %] 97 % (10/18 0113) FiO2 (%):  [100 %] 100 % (10/17 1846) Weight:  [102 lb (46.267 kg)-102 lb 4.8 oz (46.403 kg)] 102 lb 4.8 oz (46.403 kg) (10/18 0113)  Intake/Output from previous day: 10/17 0701 - 10/18 0700 In: -  Out: 300 [Urine:300] Intake/Output this shift: Total I/O In: 5 [P.O.:5] Out: -   General appearance: alert, cooperative and no distress Heart: regular rate and rhythm Lungs: clear to auscultation bilaterally Wound: clean, some crackling along chest tube site  Lab Results:  Recent Labs  02/03/15 1600  WBC 7.1  HGB 11.5*  HCT 35.4*  PLT 247   BMET:  Recent Labs  02/03/15 1600  NA 138  K 4.7  CL 107  CO2 28  GLUCOSE 97  BUN 15  CREATININE 0.66  CALCIUM 9.1    PT/INR: No results for input(s): LABPROT, INR in the last 72 hours. ABG No results found for: PHART, HCO3, TCO2, ACIDBASEDEF, O2SAT CBG (last 3)  No results for input(s): GLUCAP in the last 72 hours.  Assessment/Plan:  1. Pneumothorax- improved after removal of pigtail catheter.  There is a small area of sub cutaneous air around chest tube site 2. Pain control- Hydrocodone not providing much relief, will d/c and try Oxy IR  3. Nausea- on zofran, will add reglan for additional relief 4. Dispo- will repeat CXR in AM, if remains stable can possibly d/c patient home tomorrow   LOS: 1 day    BARRETT, ERIN 02/04/2015  Chest xray improved today I have  seen and examined Aldene Saric and agree with the above assessment  and plan.  Grace Isaac MD Beeper [phone] Office [phone] 02/04/2015 12:22 PM

## 2015-02-04 NOTE — H&P (Signed)
[facility] History and Physical  Natalie Jones HTD:[id number] DOB: [birth year] DOA: 02/03/2015  Referring physician: EDP PCP: No PCP Per Patient   Chief Complaint: Abdominal pain   HPI: Natalie Jones is a 44 y.o. female who presents to the ED with c/o 3 days of constant LUQ abdominal pain with radiation to left flank.  There is also radiation to B upper back.  CXR in the ED surprisingly showed apical PTX.  Patient was brought to [facility] and pigtail attempted to be placed unsuccessfully (not intrathoracic apparently).  This was removed and CTS evaluated the patient.  CTS has determined that chest tube is not needed at the moment, they recommend repeat CXR in AM and if unchanged to manage this without a chest tube.  Review of Systems: Systems reviewed.  As above, otherwise negative  History reviewed. No pertinent past medical history. Past Surgical History  Procedure Laterality Date  . Hand surgery     Social History:  reports that she has never smoked. She does not have any smokeless tobacco history on file. She reports that she drinks alcohol. She reports that she does not use illicit drugs.  No Known Allergies  No family history on file.   Prior to Admission medications   Medication Sig Start Date End Date Taking? Authorizing Provider  ibuprofen (ADVIL,MOTRIN) 800 MG tablet Take 800 mg by mouth daily as needed for moderate pain.  12/27/14  Yes Historical Provider, MD   Physical Exam: Filed Vitals:   02/03/15 2241  BP: 110/58  Pulse: 88  Temp:   Resp: 16    BP 110/58 mmHg  Pulse 88  Temp(Src) 98.5 F (36.9 C) (Oral)  Resp 16  Ht 5\' 2"  (1.575 m)  Wt 46.267 kg (102 lb)  BMI 18.65 kg/m2  SpO2 100%  LMP 01/18/2015  General Appearance:    Alert, oriented, no distress, appears stated age  Head:    Normocephalic, atraumatic  Eyes:    PERRL, EOMI, sclera non-icteric        Nose:   Nares without drainage or epistaxis. Mucosa, turbinates normal  Throat:   Moist mucous  membranes. Oropharynx without erythema or exudate.  Neck:   Supple. No carotid bruits.  No thyromegaly.  No lymphadenopathy.   Back:     No CVA tenderness, no spinal tenderness  Lungs:     Clear to auscultation bilaterally, without wheezes, rhonchi or rales  Chest wall:    No tenderness to palpitation  Heart:    Regular rate and rhythm without murmurs, gallops, rubs  Abdomen:     Soft, non-tender, nondistended, normal bowel sounds, no organomegaly  Genitalia:    deferred  Rectal:    deferred  Extremities:   No clubbing, cyanosis or edema.  Pulses:   2+ and symmetric all extremities  Skin:   Skin color, texture, turgor normal, no rashes or lesions  Lymph nodes:   Cervical, supraclavicular, and axillary nodes normal  Neurologic:   CNII-XII intact. Normal strength, sensation and reflexes      throughout    Labs on Admission:  Basic Metabolic Panel:  Recent Labs Lab 02/03/15 1600  NA 138  K 4.7  CL 107  CO2 28  GLUCOSE 97  BUN 15  CREATININE 0.66  CALCIUM 9.1   Liver Function Tests:  Recent Labs Lab 02/03/15 1600  AST 19  ALT 15  ALKPHOS 42  BILITOT 0.4  PROT 7.4  ALBUMIN 4.3    Recent Labs Lab 02/03/15 1600  LIPASE  23   No results for input(s): AMMONIA in the last 168 hours. CBC:  Recent Labs Lab 02/03/15 1600  WBC 7.1  NEUTROABS 3.9  HGB 11.5*  HCT 35.4*  MCV 86.3  PLT 247   Cardiac Enzymes: No results for input(s): CKTOTAL, CKMB, CKMBINDEX, TROPONINI in the last 168 hours.  BNP (last 3 results) No results for input(s): PROBNP in the last 8760 hours. CBG: No results for input(s): GLUCAP in the last 168 hours.  Radiological Exams on Admission: Dg Chest 2 View  02/03/2015  CLINICAL DATA:  Left abdominal and upper back pain. EXAM: CHEST  2 VIEW COMPARISON:  02/21/2006 chest radiograph FINDINGS: There is a moderate to large left pneumothorax. No right pneumothorax. No pleural effusion. There is minimal right right mediastinal shift. Normal heart  size. Normal mediastinal contour. Lungs appear clear, with no pulmonary edema. No displaced fracture is seen in the chest. IMPRESSION: Moderate to large left pneumothorax with minimal right mediastinal shift, suggesting tension pneumothorax. Lungs appear clear. Critical Value/emergent results were called by telephone at the time of interpretation on 02/03/2015 at 4:32 pm to Dr. Delora Fuel , who verbally acknowledged these results. Electronically Signed   By: Ilona Sorrel M.D.   On: 02/03/2015 16:34   Dg Chest Portable 1 View  02/03/2015  CLINICAL DATA:  Left pneumothorax post left chest tube placement , query intrathoracic location of the catheter. EXAM: PORTABLE CHEST 1 VIEW COMPARISON:  Earlier this day at 2238 hour FINDINGS: Portable semi upright oblique view of the thorax demonstrates left-sided catheter projecting over the left hemithorax, however chest wall versus intrathoracic location could not be determined. The left pneumothorax persists. IMPRESSION: Pigtail catheter in the left hemithorax, however intrathoracic placement cannot be confirmed. Recommend repositioning. Persistent left pneumothorax. These results were discussed via telephone at the time of interpretation on 02/03/2015 at 11:00 pm with Dr. Debby Freiberg , who verbally acknowledged these results. Electronically Signed   By: Jeb Levering M.D.   On: 02/03/2015 23:04   Dg Chest Portable 1 View  02/03/2015  CLINICAL DATA:  Left pneumothorax, post left chest tube insertion. EXAM: PORTABLE CHEST 1 VIEW COMPARISON:  Pre chest tube exam earlier this day at 1623 hour FINDINGS: Placement of left-sided pigtail catheter, tip appears coiled projecting over the lateral left fifth rib. Left pneumothorax persists, with equivocal decrease in size. No mediastinal shift. The right lung is clear. Minimal atelectasis at the medial left lung base. No edema. IMPRESSION: Placement of left chest pigtail catheter, tip coiled over the lateral hemithorax  projecting over the lateral ribs. Persistent left pneumothorax with equivocal decreased in size. It is unclear whether the catheter is intrathoracic or in the soft tissues, and additional oblique views have been performed and are pending at this time. These results were discussed by telephone at the time of the exam on 02/03/2015 at approximately 10:50 pm with Dr. Debby Freiberg , who verbally acknowledged these results. Electronically Signed   By: Jeb Levering M.D.   On: 02/03/2015 22:57    EKG: Independently reviewed.  Assessment/Plan Active Problems:   Spontaneous pneumothorax   1. Spontaneous PTX - 1. CTS has seen patient, see Dr. Everrett Coombe note 2. Repeat CXR ordered for tomorrow AM    Code Status: Full  Family Communication: No family in room Disposition Plan: Admit to inpatient   Time spent: 30 min  Autym Siess M. [facility] Pager [phone]  If 7AM-7PM, please contact the day team taking care of the patient [email or web address] Password  [email or web address] 02/04/2015, 12:07 AM

## 2015-02-04 NOTE — Progress Notes (Signed)
Patient Demographics  Natalie Jones, is a 44 y.o. female, DOB - 1970-11-05, JJK:[id number]  Admit date - 02/03/2015   Admitting Physician Etta Quill, DO  Outpatient Primary MD for the patient is No PCP Per Patient  LOS - 1   Chief Complaint  Patient presents with  . Abdominal Pain        Subjective:   [name] today has, No headache,  No abdominal pain ,  No Cough - SOB. complains at pain at her left chest  Assessment & Plan    Active Problems:   Spontaneous pneumothorax  Pneumothorax - Management per CT surgery, pigtail catheter insertion attempted by ED physician , currently discontinued , peaches x-ray today showing improvement of pneumothorax, but there is left chest subcutaneous emphysema, no known plan for chest tube, plantars repeat chest x-ray in a.m. - Continue with nausea and pain medication as needed  Code Status: full   Family Communication: none at bedside  Disposition Plan: Home once stable   Procedures  Pigtail insertion attempt in ED, discontinued   Consults   CT surgery   Medications  Scheduled Meds: . heparin  5,000 Units Subcutaneous 3 times per day  . metoCLOPramide (REGLAN) injection  10 mg Intravenous 4 times per day  . nicotine  21 mg Transdermal Once   Continuous Infusions:  PRN Meds:.ondansetron, oxyCODONE  DVT Prophylaxis   Heparin   Lab Results  Component Value Date   PLT 247 02/03/2015    Antibiotics    Anti-infectives    None          Objective:   Filed Vitals:   02/04/15 0000 02/04/15 0015 02/04/15 0030 02/04/15 0113  BP: 118/69 105/60 107/55 99/52  Pulse: 83 69 75 88  Temp:    97.7 F (36.5 C)  TempSrc:    Oral  Resp: 19  18 18   Height:    5\' 2"  (1.575 m)  Weight:    46.403 kg (102 lb 4.8 oz)  SpO2: 100% 100% 98% 97%    Wt Readings from Last 3 Encounters:  02/04/15 46.403 kg (102 lb 4.8 oz)     Intake/Output  Summary (Last 24 hours) at 02/04/15 1223 Last data filed at 02/04/15 0800  Gross per 24 hour  Intake      5 ml  Output    300 ml  Net   -295 ml     Physical Exam  Awake Alert, Oriented X 3, No new F.N deficits, Normal affect [place].AT,PERRAL Supple Neck,No JVD, No cervical lymphadenopathy appriciated.  Symmetrical Chest wall movement, Good air movement bilaterally,  RRR,No Gallops,Rubs or new Murmurs, No Parasternal Heave +ve B.Sounds, Abd Soft, No tenderness, No organomegaly appriciated, No rebound - guarding or rigidity. No Cyanosis, Clubbing or edema, No new Rash or bruise    Data Review   Micro Results No results found for this or any previous visit (from the past 240 hour(s)).  Radiology Reports Dg Chest 2 View  02/03/2015  CLINICAL DATA:  Left abdominal and upper back pain. EXAM: CHEST  2 VIEW COMPARISON:  02/21/2006 chest radiograph FINDINGS: There is a moderate to large left pneumothorax. No right pneumothorax. No pleural effusion. There is minimal right right mediastinal shift. Normal heart size.  Normal mediastinal contour. Lungs appear clear, with no pulmonary edema. No displaced fracture is seen in the chest. IMPRESSION: Moderate to large left pneumothorax with minimal right mediastinal shift, suggesting tension pneumothorax. Lungs appear clear. Critical Value/emergent results were called by telephone at the time of interpretation on 02/03/2015 at 4:32 pm to Dr. Delora Fuel , who verbally acknowledged these results. Electronically Signed   By: Ilona Sorrel M.D.   On: 02/03/2015 16:34   Dg Chest Port 1 View  02/04/2015  CLINICAL DATA:  Chest tube removal. EXAM: PORTABLE CHEST 1 VIEW COMPARISON:  02/03/2015. FINDINGS: Mediastinum and hilar structures are normal. Mild cardiomegaly. No pulmonary venous congestion. Interim removal of left chest tube. Interim improvement of known left pneumothorax. Left chest wall subcutaneous emphysema. No acute bony abnormality. IMPRESSION: 1.  Interim removal of left chest tube. Interim improvement of known left pneumothorax. Mild residual. 2. Left chest wall subcutaneous emphysema. Electronically Signed   By: Marcello Moores  Register   On: 02/04/2015 08:24   Dg Chest Portable 1 View  02/03/2015  CLINICAL DATA:  Left pneumothorax post left chest tube placement , query intrathoracic location of the catheter. EXAM: PORTABLE CHEST 1 VIEW COMPARISON:  Earlier this day at 2238 hour FINDINGS: Portable semi upright oblique view of the thorax demonstrates left-sided catheter projecting over the left hemithorax, however chest wall versus intrathoracic location could not be determined. The left pneumothorax persists. IMPRESSION: Pigtail catheter in the left hemithorax, however intrathoracic placement cannot be confirmed. Recommend repositioning. Persistent left pneumothorax. These results were discussed via telephone at the time of interpretation on 02/03/2015 at 11:00 pm with Dr. Debby Freiberg , who verbally acknowledged these results. Electronically Signed   By: Jeb Levering M.D.   On: 02/03/2015 23:04   Dg Chest Portable 1 View  02/03/2015  CLINICAL DATA:  Left pneumothorax, post left chest tube insertion. EXAM: PORTABLE CHEST 1 VIEW COMPARISON:  Pre chest tube exam earlier this day at 1623 hour FINDINGS: Placement of left-sided pigtail catheter, tip appears coiled projecting over the lateral left fifth rib. Left pneumothorax persists, with equivocal decrease in size. No mediastinal shift. The right lung is clear. Minimal atelectasis at the medial left lung base. No edema. IMPRESSION: Placement of left chest pigtail catheter, tip coiled over the lateral hemithorax projecting over the lateral ribs. Persistent left pneumothorax with equivocal decreased in size. It is unclear whether the catheter is intrathoracic or in the soft tissues, and additional oblique views have been performed and are pending at this time. These results were discussed by telephone at  the time of the exam on 02/03/2015 at approximately 10:50 pm with Dr. Debby Freiberg , who verbally acknowledged these results. Electronically Signed   By: Jeb Levering M.D.   On: 02/03/2015 22:57     CBC  Recent Labs Lab 02/03/15 1600  WBC 7.1  HGB 11.5*  HCT 35.4*  PLT 247  MCV 86.3  MCH 28.0  MCHC 32.5  RDW 13.5  LYMPHSABS 2.2  MONOABS 0.6  EOSABS 0.4  BASOSABS 0.0    Chemistries   Recent Labs Lab 02/03/15 1600  NA 138  K 4.7  CL 107  CO2 28  GLUCOSE 97  BUN 15  CREATININE 0.66  CALCIUM 9.1  AST 19  ALT 15  ALKPHOS 42  BILITOT 0.4   ------------------------------------------------------------------------------------------------------------------ estimated creatinine clearance is 65.7 mL/min (by C-G formula based on Cr of 0.66). ------------------------------------------------------------------------------------------------------------------ No results for input(s): HGBA1C in the last 72 hours. ------------------------------------------------------------------------------------------------------------------ No results for input(s):  CHOL, HDL, LDLCALC, TRIG, CHOLHDL, LDLDIRECT in the last 72 hours. ------------------------------------------------------------------------------------------------------------------ No results for input(s): TSH, T4TOTAL, T3FREE, THYROIDAB in the last 72 hours.  Invalid input(s): FREET3 ------------------------------------------------------------------------------------------------------------------ No results for input(s): VITAMINB12, FOLATE, FERRITIN, TIBC, IRON, RETICCTPCT in the last 72 hours.  Coagulation profile No results for input(s): INR, PROTIME in the last 168 hours.  No results for input(s): DDIMER in the last 72 hours.  Cardiac Enzymes No results for input(s): CKMB, TROPONINI, MYOGLOBIN in the last 168 hours.  Invalid input(s):  CK ------------------------------------------------------------------------------------------------------------------ Invalid input(s): POCBNP     Time Spent in minutes   No charge   [facility], DAWOOD M.D on 02/04/2015 at 12:23 PM  Between 7am to 7pm - Pager - [phone]  After 7pm go to [email or web address] - password [facility]  [facility]   Office  [phone]

## 2015-02-05 ENCOUNTER — Inpatient Hospital Stay (HOSPITAL_COMMUNITY): Payer: BLUE CROSS/BLUE SHIELD

## 2015-02-05 DIAGNOSIS — Z720 Tobacco use: Secondary | ICD-10-CM | POA: Diagnosis present

## 2015-02-05 DIAGNOSIS — J9383 Other pneumothorax: Principal | ICD-10-CM

## 2015-02-05 MED ORDER — OXYCODONE HCL 5 MG PO TABS: 5.0000 mg | ORAL_TABLET | ORAL | Status: DC | PRN

## 2015-02-05 MED ORDER — ONDANSETRON 4 MG PO TBDP: 4.0000 mg | ORAL_TABLET | Freq: Three times a day (TID) | ORAL | Status: DC | PRN

## 2015-02-05 MED ORDER — NICOTINE 21 MG/24HR TD PT24: 21.0000 mg | MEDICATED_PATCH | Freq: Every day | TRANSDERMAL | Status: DC

## 2015-02-05 NOTE — Progress Notes (Deleted)
PROGRESS NOTE  Natalie Jones PNT:[id number] DOB: [birth year] DOA: 02/03/2015 PCP: No PCP Per Patient   HPI: Ms. Natalie Jones is a 44 y/o female with history of tobacco use who presented to the ED 10/17 with complaints of LUQ abdominal pain with radiation to the back. CXR performed at the time showed L apical pneumothorax. Pigtail insertion attempted and was unsuccessful, CT surgery consulted and recommended conservative management w/o chest tube and to repeat CXR.   10/19 - CXR performed today shows increase in PTX, now 10-15%    Subjective / 24 H Interval events -Patient is asx at this time - denies CP, SOB, cough  -O2 saturation 100% on RA    Assessment/Plan: Active Problems:   Spontaneous pneumothorax  1. Spontaneous Pneumothorax -CXR performed today shows increase in size, now 10-15%. Patient remains asymptomatic with stable vital signs  -CT surgery recommends continued conservative management w/o chest tube and repeat CXR in AM 10/20 -Continue close monitoring of symptoms/vital signs, pain meds PRN    Diet: Diet regular Room service appropriate?: Yes; Fluid consistency:: Thin Fluids: None DVT Prophylaxis: Anticoagulation with heparin  Code Status: Full Code Family Communication: Family not present at bedside currently   Disposition Plan: TBD  Barriers to discharge: Repeat CXR tomorrow AM   Consultants:  CT Surgery   Procedures:  None   Antibiotics  Anti-infectives    None       Studies  Dg Chest 2 View  02/05/2015  CLINICAL DATA:  Shortness of breath. Follow-up pneumothorax after chest tube removal EXAM: CHEST  2 VIEW COMPARISON:  Yesterday FINDINGS: Left pneumothorax has enlarged since previous, now estimated at 10-15%. An pleural adhesion is noted at the apex. Small left pleural effusion. Stable right lung aeration. Unremarkable heart size and aortic contours. These results were called by telephone at the time of interpretation on 02/05/2015 at 8:11 am to Dr.  Cruzita Lederer, who verbally acknowledged these results. IMPRESSION: Interval increase in the left pneumothorax, now ~10-15%. Electronically Signed   By: Monte Fantasia M.D.   On: 02/05/2015 08:12   Dg Chest 2 View  02/03/2015  CLINICAL DATA:  Left abdominal and upper back pain. EXAM: CHEST  2 VIEW COMPARISON:  02/21/2006 chest radiograph FINDINGS: There is a moderate to large left pneumothorax. No right pneumothorax. No pleural effusion. There is minimal right right mediastinal shift. Normal heart size. Normal mediastinal contour. Lungs appear clear, with no pulmonary edema. No displaced fracture is seen in the chest. IMPRESSION: Moderate to large left pneumothorax with minimal right mediastinal shift, suggesting tension pneumothorax. Lungs appear clear. Critical Value/emergent results were called by telephone at the time of interpretation on 02/03/2015 at 4:32 pm to Dr. Delora Fuel , who verbally acknowledged these results. Electronically Signed   By: Ilona Sorrel M.D.   On: 02/03/2015 16:34   Dg Chest Port 1 View  02/04/2015  CLINICAL DATA:  Chest tube removal. EXAM: PORTABLE CHEST 1 VIEW COMPARISON:  02/03/2015. FINDINGS: Mediastinum and hilar structures are normal. Mild cardiomegaly. No pulmonary venous congestion. Interim removal of left chest tube. Interim improvement of known left pneumothorax. Left chest wall subcutaneous emphysema. No acute bony abnormality. IMPRESSION: 1. Interim removal of left chest tube. Interim improvement of known left pneumothorax. Mild residual. 2. Left chest wall subcutaneous emphysema. Electronically Signed   By: Marcello Moores  Register   On: 02/04/2015 08:24   Dg Chest Portable 1 View  02/03/2015  CLINICAL DATA:  Left pneumothorax post left chest tube placement , query intrathoracic location of  the catheter. EXAM: PORTABLE CHEST 1 VIEW COMPARISON:  Earlier this day at 2238 hour FINDINGS: Portable semi upright oblique view of the thorax demonstrates left-sided catheter projecting  over the left hemithorax, however chest wall versus intrathoracic location could not be determined. The left pneumothorax persists. IMPRESSION: Pigtail catheter in the left hemithorax, however intrathoracic placement cannot be confirmed. Recommend repositioning. Persistent left pneumothorax. These results were discussed via telephone at the time of interpretation on 02/03/2015 at 11:00 pm with Dr. Debby Freiberg , who verbally acknowledged these results. Electronically Signed   By: Jeb Levering M.D.   On: 02/03/2015 23:04   Dg Chest Portable 1 View  02/03/2015  CLINICAL DATA:  Left pneumothorax, post left chest tube insertion. EXAM: PORTABLE CHEST 1 VIEW COMPARISON:  Pre chest tube exam earlier this day at 1623 hour FINDINGS: Placement of left-sided pigtail catheter, tip appears coiled projecting over the lateral left fifth rib. Left pneumothorax persists, with equivocal decrease in size. No mediastinal shift. The right lung is clear. Minimal atelectasis at the medial left lung base. No edema. IMPRESSION: Placement of left chest pigtail catheter, tip coiled over the lateral hemithorax projecting over the lateral ribs. Persistent left pneumothorax with equivocal decreased in size. It is unclear whether the catheter is intrathoracic or in the soft tissues, and additional oblique views have been performed and are pending at this time. These results were discussed by telephone at the time of the exam on 02/03/2015 at approximately 10:50 pm with Dr. Debby Freiberg , who verbally acknowledged these results. Electronically Signed   By: Jeb Levering M.D.   On: 02/03/2015 22:57    Objective  Filed Vitals:   02/04/15 0030 02/04/15 0113 02/04/15 1255 02/05/15 0431  BP: 107/55 99/52 91/51  101/55  Pulse: 75 88 77 77  Temp:  97.7 F (36.5 C) 98.8 F (37.1 C) 97.5 F (36.4 C)  TempSrc:  Oral Oral Oral  Resp: 18 18 19 20   Height:  5\' 2"  (1.575 m)    Weight:  46.403 kg (102 lb 4.8 oz)  45.813 kg (101 lb)    SpO2: 98% 97% 100% 100%    Intake/Output Summary (Last 24 hours) at 02/05/15 1215 Last data filed at 02/05/15 0921  Gross per 24 hour  Intake    340 ml  Output      0 ml  Net    340 ml   Filed Weights   02/03/15 1334 02/04/15 0113 02/05/15 0431  Weight: 46.267 kg (102 lb) 46.403 kg (102 lb 4.8 oz) 45.813 kg (101 lb)    Exam:  GENERAL: NAD, well-appearing female sitting upright in bed   HEENT: head NCAT, no scleral icterus. Mucous membranes are moist.   NECK: Supple. No LAD  LUNGS: Clear to auscultation. No wheezing or crackles  HEART: Regular rate and rhythm without murmur. 2+ pulses, no JVD, no peripheral edema  ABDOMEN: Soft, non-distended, non-tender.   EXTREMITIES: Without any cyanosis or clubbing. Good muscle tone  NEUROLOGIC: No focal neurologic deficits  PSYCHIATRIC: Normal mood and affect  SKIN: No ulceration, induration, rashes present.  Data Reviewed: Basic Metabolic Panel:  Recent Labs Lab 02/03/15 1600  NA 138  K 4.7  CL 107  CO2 28  GLUCOSE 97  BUN 15  CREATININE 0.66  CALCIUM 9.1   Liver Function Tests:  Recent Labs Lab 02/03/15 1600  AST 19  ALT 15  ALKPHOS 42  BILITOT 0.4  PROT 7.4  ALBUMIN 4.3    Recent Labs Lab 02/03/15  1600  LIPASE 23   No results for input(s): AMMONIA in the last 168 hours. CBC:  Recent Labs Lab 02/03/15 1600  WBC 7.1  NEUTROABS 3.9  HGB 11.5*  HCT 35.4*  MCV 86.3  PLT 247   Cardiac Enzymes:  BNP (last 3 results)  ProBNP (last 3 results)  CBG:   Scheduled Meds: . amphetamine-dextroamphetamine  20 mg Oral 3 times per day  . heparin  5,000 Units Subcutaneous 3 times per day  . metoCLOPramide (REGLAN) injection  10 mg Intravenous 4 times per day  . nicotine  21 mg Transdermal Daily   Continuous Infusions:   Time spent coordinating care: 30 minutes, greater than 50% of this time was spent in counseling, explanation of diagnosis, planning of further management, coordination of care.  All images within past 24 hours personally reviewed.  Elita Boone, PA-S [facility] 02/05/2015, 12:15 PM  LOS: 2 days

## 2015-02-05 NOTE — Discharge Instructions (Signed)
Pneumothorax °A pneumothorax, commonly called a collapsed lung, is a condition in which air leaks from a lung and builds up in the space between the lung and the chest wall (pleural space). The air in a pneumothorax is trapped outside the lung and takes up space, preventing the lung from fully expanding. This is a condition that usually occurs suddenly. The buildup of air may be small or large. A small pneumothorax may go away on its own. When a pneumothorax is larger, it will often require medical treatment and hospitalization.  °CAUSES  °A pneumothorax can sometimes happen quickly with no apparent cause. People with underlying lung problems, particularly COPD or emphysema, are at higher risk of pneumothorax. However, pneumothorax can happen quickly even in people with no prior known lung problems. Trauma, surgery, medical procedures, or injury to the chest wall can also cause a pneumothorax. °SIGNS AND SYMPTOMS  °Sometimes a pneumothorax will have no symptoms. When symptoms are present, they can include: °· Chest pain. °· Shortness of breath. °· Increased rate of breathing. °· Bluish color to your lips or skin (cyanosis). °DIAGNOSIS  °Pneumothorax is usually diagnosed by a chest X-ray or chest CT scan. Your health care provider will also take a medical history and perform a physical exam to determine why you may have a pneumothorax. °TREATMENT  °A small pneumothorax may go away on its own without treatment. Extra oxygen can sometimes help a small pneumothorax go away more quickly. For a larger pneumothorax or a pneumothorax that is causing symptoms, a procedure is usually needed to drain the air. In some cases, the health care provider may drain the air using a needle. In other cases, a chest tube may be inserted into the pleural space. A chest tube is a small tube placed between the ribs and into the pleural space. This removes the extra air and allows the lung to expand back to its normal size. A large  pneumothorax will usually require a hospital stay. If there is ongoing air leakage into the pleural space, then the chest tube may need to remain in place for several days until the air leak has healed. In some cases, surgery may be needed.  °HOME CARE INSTRUCTIONS  °· Only take over-the-counter or prescription medicines as directed by your health care provider. °· If a cough or pain makes it difficult for you to sleep at night, try sleeping in a semi-upright position in a recliner or by using 2 or 3 pillows. °· Rest and limit activity as directed by your health care provider. °· If you had a chest tube and it was removed, ask your health care provider when it is okay to remove the dressing. Until your health care provider says you can remove the dressing, do not allow it to get wet. °· Do not smoke. Smoking is a risk factor for pneumothorax. °· Do not fly in an airplane or scuba dive until your health care provider says it is okay. °· Follow up with your health care provider as directed. °SEEK IMMEDIATE MEDICAL CARE IF:  °· You have increasing chest pain or shortness of breath. °· You have a cough that is not controlled with suppressants. °· You begin coughing up blood. °· You have pain that is getting worse or is not controlled with medicines. °· You cough up thick, discolored mucus (sputum) that is yellow to green in color. °· You have redness, increasing pain, or discharge at the site where a chest tube had been in place (if   your pneumothorax was treated with a chest tube). °· The site where your chest tube was located opens up. °· You feel air coming out of the site where the chest tube was placed. °· You have a fever or persistent symptoms for more than 2-3 days. °· You have a fever and your symptoms suddenly get worse. °MAKE SURE YOU:  °· Understand these instructions. °· Will watch your condition. °· Will get help right away if you are not doing well or get worse. °  °This information is not intended to  replace advice given to you by your health care provider. Make sure you discuss any questions you have with your health care provider. °  °Document Released: 04/05/2005 Document Revised: 01/24/2013 Document Reviewed: 11/02/2012 °Elsevier Interactive Patient Education ©2016 [employer] ° °

## 2015-02-05 NOTE — Progress Notes (Signed)
Discharge teaching and instructions reviewed with pt. Pt discharging home with parents. Note excusing her from work and prescriptions given.

## 2015-02-05 NOTE — Discharge Summary (Addendum)
Physician Discharge Summary  Natalie Jones WFU:[id number] DOB: 1970/11/29 DOA: 02/03/2015  PCP: No PCP Per Patient  Admit date: 02/03/2015 Discharge date: 02/05/2015  Time spent: > 35 minutes  Recommendations for Outpatient Follow-up:  1. Appointment with Dr. Servando Snare on 02/10/2015 with repeat Chest X-ray at that time   Discharge Diagnoses:  Active Problems:   Spontaneous pneumothorax   Tobacco abuse   Discharge Condition: Stable  Diet recommendation: Regular Diet   Filed Weights   02/03/15 1334 02/04/15 0113 02/05/15 0431  Weight: 46.267 kg (102 lb) 46.403 kg (102 lb 4.8 oz) 45.813 kg (101 lb)    History of present illness:  Natalie Jones is a 44 y/o female with history of tobacco use who presented to the ED 10/17 with complaints of LUQ abdominal pain with radiation to the back. CXR performed at the time showed L apical pneumothorax. Patient was brought to [facility] and pigtail attempted to be placed unsuccessfully (not intrathoracic apparently). This was removed and CTS evaluated the patient. CTS has determined that chest tube is not needed at the moment, they recommend repeat CXR in AM and if unchanged to manage this without a chest tube.  Hospital Course:  Pigtail insertion attempted in the ED and was unsuccessful, CT surgery consulted and recommended conservative management w/o chest tube and to repeat CXR. CXR peformed 10/19 showed increase in size, now 10-15%. She remained asymptomatic at this time with no complaints of chest pain or SOB. CT surgery evaluated the patient and felt that there was no indication for chest tube and that the patient could be discharged home with follow-up in the office x 1 week for a repeat CXR. She was counseled for tobacco cessation and a nicotine script was given to patient. She was discharged home in stable condition with close outpatient follow up with cardiothoracic surgery, she already has an appointment in 5 days.  Procedures:  None     Consultations:  Cardiothoracic Surgery   Discharge Exam: Filed Vitals:   02/04/15 0113 02/04/15 1255 02/05/15 0431 02/05/15 1357  BP: 99/52 91/51 101/55 103/53  Pulse: 88 77 77 92  Temp: 97.7 F (36.5 C) 98.8 F (37.1 C) 97.5 F (36.4 C) 98.5 F (36.9 C)  TempSrc: Oral Oral Oral Oral  Resp: 18 19 20 20   Height: 5\' 2"  (1.575 m)     Weight: 46.403 kg (102 lb 4.8 oz)  45.813 kg (101 lb)   SpO2: 97% 100% 100% 100%    General: At discharge patient is in NAD, pleasant affect, mother at bedside.  Cardiovascular: Regular rate and rhythm, clear S1/S2. No murmurs, rubs or gallops.  Respiratory: Breath sounds clear and equal bilaterally. No rales, rhonchi or wheezes.   Discharge Instructions     Medication List    TAKE these medications        amphetamine-dextroamphetamine 20 MG tablet  Commonly known as:  ADDERALL  Take 20 mg by mouth 3 (three) times daily. Takes at 0700, 1200, and 1600.     ibuprofen 800 MG tablet  Commonly known as:  ADVIL,MOTRIN  Take 800 mg by mouth daily as needed for moderate pain.     nicotine 21 mg/24hr patch  Commonly known as:  NICODERM CQ - dosed in mg/24 hours  Place 1 patch (21 mg total) onto the skin daily.     ondansetron 4 MG disintegrating tablet  Commonly known as:  ZOFRAN ODT  Take 1 tablet (4 mg total) by mouth every 8 (eight) hours as needed  for nausea or vomiting.     oxyCODONE 5 MG immediate release tablet  Commonly known as:  Oxy IR/ROXICODONE  Take 1-2 tablets (5-10 mg total) by mouth every 4 (four) hours as needed for severe pain.       Follow-up Information    Follow up with Grace Isaac, MD On 02/10/2015.   Specialty:  Cardiothoracic Surgery   Why:  PA/LAT CXR to be taken (at [facility] which is in the same building as Dr. Everrett Coombe office) on 02/10/2015 at 2:15;Appointment time is at 3:00 pm   Contact information:   [address] [place] [address] [phone]      The results of  significant diagnostics from this hospitalization (including imaging, microbiology, ancillary and laboratory) are listed below for reference.    Significant Diagnostic Studies: Dg Chest 2 View  02/05/2015  CLINICAL DATA:  Shortness of breath. Follow-up pneumothorax after chest tube removal EXAM: CHEST  2 VIEW COMPARISON:  Yesterday FINDINGS: Left pneumothorax has enlarged since previous, now estimated at 10-15%. An pleural adhesion is noted at the apex. Small left pleural effusion. Stable right lung aeration. Unremarkable heart size and aortic contours. These results were called by telephone at the time of interpretation on 02/05/2015 at 8:11 am to Dr. Cruzita Lederer, who verbally acknowledged these results. IMPRESSION: Interval increase in the left pneumothorax, now ~10-15%. Electronically Signed   By: Monte Fantasia M.D.   On: 02/05/2015 08:12   Dg Chest 2 View  02/03/2015  CLINICAL DATA:  Left abdominal and upper back pain. EXAM: CHEST  2 VIEW COMPARISON:  02/21/2006 chest radiograph FINDINGS: There is a moderate to large left pneumothorax. No right pneumothorax. No pleural effusion. There is minimal right right mediastinal shift. Normal heart size. Normal mediastinal contour. Lungs appear clear, with no pulmonary edema. No displaced fracture is seen in the chest. IMPRESSION: Moderate to large left pneumothorax with minimal right mediastinal shift, suggesting tension pneumothorax. Lungs appear clear. Critical Value/emergent results were called by telephone at the time of interpretation on 02/03/2015 at 4:32 pm to Dr. Delora Fuel , who verbally acknowledged these results. Electronically Signed   By: Ilona Sorrel M.D.   On: 02/03/2015 16:34   Dg Chest Port 1 View  02/04/2015  CLINICAL DATA:  Chest tube removal. EXAM: PORTABLE CHEST 1 VIEW COMPARISON:  02/03/2015. FINDINGS: Mediastinum and hilar structures are normal. Mild cardiomegaly. No pulmonary venous congestion. Interim removal of left chest tube.  Interim improvement of known left pneumothorax. Left chest wall subcutaneous emphysema. No acute bony abnormality. IMPRESSION: 1. Interim removal of left chest tube. Interim improvement of known left pneumothorax. Mild residual. 2. Left chest wall subcutaneous emphysema. Electronically Signed   By: Marcello Moores  Register   On: 02/04/2015 08:24   Dg Chest Portable 1 View  02/03/2015  CLINICAL DATA:  Left pneumothorax post left chest tube placement , query intrathoracic location of the catheter. EXAM: PORTABLE CHEST 1 VIEW COMPARISON:  Earlier this day at 2238 hour FINDINGS: Portable semi upright oblique view of the thorax demonstrates left-sided catheter projecting over the left hemithorax, however chest wall versus intrathoracic location could not be determined. The left pneumothorax persists. IMPRESSION: Pigtail catheter in the left hemithorax, however intrathoracic placement cannot be confirmed. Recommend repositioning. Persistent left pneumothorax. These results were discussed via telephone at the time of interpretation on 02/03/2015 at 11:00 pm with Dr. Debby Freiberg , who verbally acknowledged these results. Electronically Signed   By: Jeb Levering M.D.   On:  02/03/2015 23:04   Dg Chest Portable 1 View  02/03/2015  CLINICAL DATA:  Left pneumothorax, post left chest tube insertion. EXAM: PORTABLE CHEST 1 VIEW COMPARISON:  Pre chest tube exam earlier this day at 1623 hour FINDINGS: Placement of left-sided pigtail catheter, tip appears coiled projecting over the lateral left fifth rib. Left pneumothorax persists, with equivocal decrease in size. No mediastinal shift. The right lung is clear. Minimal atelectasis at the medial left lung base. No edema. IMPRESSION: Placement of left chest pigtail catheter, tip coiled over the lateral hemithorax projecting over the lateral ribs. Persistent left pneumothorax with equivocal decreased in size. It is unclear whether the catheter is intrathoracic or in the soft  tissues, and additional oblique views have been performed and are pending at this time. These results were discussed by telephone at the time of the exam on 02/03/2015 at approximately 10:50 pm with Dr. Debby Freiberg , who verbally acknowledged these results. Electronically Signed   By: Jeb Levering M.D.   On: 02/03/2015 22:57   Labs: Basic Metabolic Panel:  Recent Labs Lab 02/03/15 1600  NA 138  K 4.7  CL 107  CO2 28  GLUCOSE 97  BUN 15  CREATININE 0.66  CALCIUM 9.1   Liver Function Tests:  Recent Labs Lab 02/03/15 1600  AST 19  ALT 15  ALKPHOS 42  BILITOT 0.4  PROT 7.4  ALBUMIN 4.3    Recent Labs Lab 02/03/15 1600  LIPASE 23   CBC:  Recent Labs Lab 02/03/15 1600  WBC 7.1  NEUTROABS 3.9  HGB 11.5*  HCT 35.4*  MCV 86.3  PLT 247    Signed:  GHERGHE, COSTIN PA-S   Costin M. Cruzita Lederer, MD [phone] [facility] 02/05/2015, 2:29 PM

## 2015-02-05 NOTE — Progress Notes (Addendum)
      [address]       [place] [address]             [phone]       Subjective:  Natalie Jones has no new complaints.  Her pain is under better control compared to yesterday.  Objective: Vital signs in last 24 hours: Temp:  [97.5 F (36.4 C)-98.8 F (37.1 C)] 97.5 F (36.4 C) (10/19 0431) Pulse Rate:  [77] 77 (10/19 0431) Cardiac Rhythm:  [-] Normal sinus rhythm (10/19 0744) Resp:  [19-20] 20 (10/19 0431) BP: (91-101)/(51-55) 101/55 mmHg (10/19 0431) SpO2:  [100 %] 100 % (10/19 0431) Weight:  [101 lb (45.813 kg)] 101 lb (45.813 kg) (10/19 0431)  Intake/Output from previous day: 10/18 0701 - 10/19 0700 In: 245 [P.O.:245] Out: -  Intake/Output this shift: Total I/O In: 100 [P.O.:100] Out: -   General appearance: alert, cooperative and no distress Heart: regular rate and rhythm Lungs: clear to auscultation bilaterally Abdomen: soft, non-tender; bowel sounds normal; no masses,  no organomegaly Wound: clean and dry  Lab Results:  Recent Labs  02/03/15 1600  WBC 7.1  HGB 11.5*  HCT 35.4*  PLT 247   BMET:  Recent Labs  02/03/15 1600  NA 138  K 4.7  CL 107  CO2 28  GLUCOSE 97  BUN 15  CREATININE 0.66  CALCIUM 9.1    PT/INR: No results for input(s): LABPROT, INR in the last 72 hours. ABG No results found for: PHART, HCO3, TCO2, ACIDBASEDEF, O2SAT CBG (last 3)  No results for input(s): GLUCAP in the last 72 hours.  Assessment/Plan:  1. Pneumothorax- increased in size, now approximately 10-15% with apical adhesion, improvement of sub-cutaneous emphysema no indication for chest tube at this time... This is the same size as presentation.  CXR reviewed with Dr. Servando Snare and he feels the patient can be discharged with follow up in our office for 1 week with repeat CXR 2. Dispo- care per primary   LOS: 2 days    Canio Winokur 02/05/2015

## 2015-02-07 ENCOUNTER — Other Ambulatory Visit: Payer: Self-pay | Admitting: Cardiothoracic Surgery

## 2015-02-07 DIAGNOSIS — J9383 Other pneumothorax: Secondary | ICD-10-CM

## 2015-02-10 ENCOUNTER — Ambulatory Visit
Admission: RE | Admit: 2015-02-10 | Discharge: 2015-02-10 | Disposition: A | Discharge status: Home / Self Care | Payer: BLUE CROSS/BLUE SHIELD | Source: Ambulatory Visit | Attending: Cardiothoracic Surgery | Admitting: Cardiothoracic Surgery

## 2015-02-10 ENCOUNTER — Ambulatory Visit (INDEPENDENT_AMBULATORY_CARE_PROVIDER_SITE_OTHER): Payer: BLUE CROSS/BLUE SHIELD | Admitting: Physician Assistant

## 2015-02-10 VITALS — BP 111/67 | HR 108 | Resp 16 | Ht 62.0 in | Wt 102.0 lb

## 2015-02-10 DIAGNOSIS — J939 Pneumothorax, unspecified: Secondary | ICD-10-CM | POA: Diagnosis not present

## 2015-02-10 DIAGNOSIS — J9383 Other pneumothorax: Secondary | ICD-10-CM

## 2015-02-10 NOTE — Progress Notes (Signed)
[address]       [place] [address]             [phone]          HPI: Patient returns for a 1 week hospital follow-up. She was recently seen in the ER with left flank and abdominal pain.  Chest x-ray showed a left spontaneous pneumothorax.  A left pigtail catheter was placed, but this was unsuccessful, as the tube was not intrathoracic.  She was seen by the hospitalist service and admitted for further evaluation. TCTS was consulted and the pigtail drain was removed as it was not functional.  She was followed with serial x-rays, and the pneumothorax remained stable and slowly improved. The patient was discharged home on 02/05/2015 and at that time, chest x-ray showed the pneumothorax to be 10-15%.  The patient has remained stable.  She had one episode of similar flank and abdominal pain since discharge which was relieved with pain medication.  She denies any chest pain, shortness of breath or other new symptoms.  She has not smoked since admission, and is now using a nicotine patch.     Current Outpatient Prescriptions  Medication Sig Dispense Refill  . amphetamine-dextroamphetamine (ADDERALL) 20 MG tablet Take 20 mg by mouth 3 (three) times daily. Takes at 0700, 1200, and 1600.    . ibuprofen (ADVIL,MOTRIN) 800 MG tablet Take 800 mg by mouth daily as needed for moderate pain.     . nicotine (NICODERM CQ - DOSED IN MG/24 HOURS) 21 mg/24hr patch Place 1 patch (21 mg total) onto the skin daily. 28 patch 0  . ondansetron (ZOFRAN ODT) 4 MG disintegrating tablet Take 1 tablet (4 mg total) by mouth every 8 (eight) hours as needed for nausea or vomiting. 20 tablet 0  . oxyCODONE (OXY IR/ROXICODONE) 5 MG immediate release tablet Take 1-2 tablets (5-10 mg total) by mouth every 4 (four) hours as needed for severe pain. 30 tablet 0   No current facility-administered medications for this visit.     Physical Exam: BP 111/67 HR 108 Resp 16 Wounds: Site from pigtail  insertion is clean and dry, no surrounding erythema or subcutaneous emphysema Heart: regular rate and rhythm Lungs: Clear to auscultation    Diagnostic Tests: Chest xray: Dg Chest 2 View  02/10/2015  CLINICAL DATA:  Followup pneumothorax EXAM: CHEST  2 VIEW COMPARISON:  None. FINDINGS: The left-sided pneumothorax overlying the upper lobe has decreased in volume from previous exam. On today's study this has a thickness of 1 cm. Previously 1.9 cm. Heart size appears normal. No pleural effusion or edema. No airspace consolidation. IMPRESSION: Persistent but decreased in volume left-sided pneumothorax. Electronically Signed   By: Kerby Moors M.D.   On: 02/10/2015 14:34       Assessment/Plan: The patient returns for follow up of a spontaneous left pneumothorax.  She has remained asymptomatic and stable since discharge.  Chest x-ray today shows a small left apical pneumothorax, probably less than 10% at this point and definitely improved.  This should continue to improve and will not require any further intervention. I have reviewed symptoms she should watch for including chest pain and shortness of breath, and have asked her to return or call if she has any recurrent symptoms.  I have encouraged her to continue to avoid heavy lifting or strenuous activity for the next few weeks, no smoking, and no flying for the next 1-2 months. I have given her  a note to return to work at her job at [employer].  We will see her back as needed.

## 2015-02-25 ENCOUNTER — Other Ambulatory Visit: Payer: Self-pay | Admitting: *Deleted

## 2015-02-25 DIAGNOSIS — Z720 Tobacco use: Secondary | ICD-10-CM

## 2015-02-25 MED ORDER — NICOTINE 14 MG/24HR TD PT24: 14.0000 mg | MEDICATED_PATCH | Freq: Every day | TRANSDERMAL | Status: DC

## 2015-06-18 ENCOUNTER — Other Ambulatory Visit: Payer: Self-pay

## 2015-06-18 DIAGNOSIS — Z1231 Encounter for screening mammogram for malignant neoplasm of breast: Secondary | ICD-10-CM

## 2015-07-08 DIAGNOSIS — F900 Attention-deficit hyperactivity disorder, predominantly inattentive type: Secondary | ICD-10-CM | POA: Insufficient documentation

## 2015-07-28 ENCOUNTER — Ambulatory Visit: Payer: BLUE CROSS/BLUE SHIELD

## 2015-08-04 ENCOUNTER — Ambulatory Visit
Admission: RE | Admit: 2015-08-04 | Discharge: 2015-08-04 | Disposition: A | Discharge status: Home / Self Care | Payer: BLUE CROSS/BLUE SHIELD | Source: Ambulatory Visit

## 2015-08-04 DIAGNOSIS — Z1231 Encounter for screening mammogram for malignant neoplasm of breast: Secondary | ICD-10-CM

## 2015-11-08 DIAGNOSIS — F900 Attention-deficit hyperactivity disorder, predominantly inattentive type: Secondary | ICD-10-CM | POA: Diagnosis not present

## 2016-04-16 ENCOUNTER — Ambulatory Visit: Payer: BLUE CROSS/BLUE SHIELD | Admitting: Family Medicine

## 2016-04-28 DIAGNOSIS — F900 Attention-deficit hyperactivity disorder, predominantly inattentive type: Secondary | ICD-10-CM | POA: Diagnosis not present

## 2016-05-03 ENCOUNTER — Encounter: Payer: Self-pay | Admitting: Family Medicine

## 2016-05-03 ENCOUNTER — Ambulatory Visit (INDEPENDENT_AMBULATORY_CARE_PROVIDER_SITE_OTHER): Payer: BLUE CROSS/BLUE SHIELD | Admitting: Family Medicine

## 2016-05-03 VITALS — BP 102/60 | HR 92 | Temp 98.7°F | Resp 14 | Ht 64.0 in | Wt 104.0 lb

## 2016-05-03 DIAGNOSIS — Z720 Tobacco use: Secondary | ICD-10-CM | POA: Diagnosis not present

## 2016-05-03 DIAGNOSIS — Z0000 Encounter for general adult medical examination without abnormal findings: Secondary | ICD-10-CM

## 2016-05-03 MED ORDER — NICOTINE 14 MG/24HR TD PT24: 14.0000 mg | MEDICATED_PATCH | Freq: Every day | TRANSDERMAL | 0 refills | Status: DC

## 2016-05-03 NOTE — Progress Notes (Signed)
Pre visit review using our clinic review tool, if applicable. No additional management support is needed unless otherwise documented below in the visit note. 

## 2016-05-03 NOTE — Assessment & Plan Note (Signed)
Pt's PE WNL w/ exception of being underweight.  UTD on mammo and flu shot.  Due for pap- pt plans to call GYN.  Check labs.  Anticipatory guidance provided.

## 2016-05-03 NOTE — Progress Notes (Signed)
   Subjective:    Patient ID: Natalie Jones, female    DOB: [birth year], 46 y.o.   MRN: [id number]  HPI New to establish.  Previous MD- none recently   GYN- [place] [facility] (doing Adderall)  CPE- UTD on mammo (due come April), will call GYN to find out when pap is needed.  UTD on flu shot.  Pt quit smoking after pneumothorax but resumed.  Currently smoking <1/2ppd.  Wants to restart patches    Review of Systems Patient reports no vision/ hearing changes, adenopathy,fever, weight change,  persistant/recurrent hoarseness , swallowing issues, chest pain, palpitations, edema, persistant/recurrent cough, hemoptysis, dyspnea (rest/exertional/paroxysmal nocturnal), gastrointestinal bleeding (melena, rectal bleeding), abdominal pain, significant heartburn, bowel changes, GU symptoms (dysuria, hematuria, incontinence), Gyn symptoms (abnormal  bleeding, pain),  syncope, focal weakness, memory loss, numbness & tingling, skin/hair/nail changes, abnormal bruising or bleeding, anxiety, or depression.     Objective:   Physical Exam General Appearance:    Alert, cooperative, no distress, appears stated age  Head:    Normocephalic, without obvious abnormality, atraumatic  Eyes:    PERRL, conjunctiva/corneas clear, EOM's intact, fundi    benign, both eyes  Ears:    Normal TM's and external ear canals, both ears  Nose:   Nares normal, septum midline, mucosa normal, no drainage    or sinus tenderness  Throat:   Lips, mucosa, and tongue normal; teeth and gums normal  Neck:   Supple, symmetrical, trachea midline, no adenopathy;    Thyroid: no enlargement/tenderness/nodules  Back:     Symmetric, no curvature, ROM normal, no CVA tenderness  Lungs:     Clear to auscultation bilaterally, respirations unlabored  Chest Wall:    No tenderness or deformity   Heart:    Regular rate and rhythm, S1 and S2 normal, no murmur, rub   or gallop  Breast Exam:    Deferred to GYN  Abdomen:     Soft, non-tender,  bowel sounds active all four quadrants,    no masses, no organomegaly  Genitalia:    Deferred to GYN  Rectal:    Extremities:   Extremities normal, atraumatic, no cyanosis or edema  Pulses:   2+ and symmetric all extremities  Skin:   Skin color, texture, turgor normal, no rashes or lesions  Lymph nodes:   Cervical, supraclavicular, and axillary nodes normal  Neurologic:   CNII-XII intact, normal strength, sensation and reflexes    throughout          Assessment & Plan:

## 2016-05-03 NOTE — Patient Instructions (Signed)
Schedule a fasting lab visit at your convenience We'll notify you of your lab results once they are available Use the patches to quit smoking!  After 1 month (when you are due for a refill) call and we'll send in a script for the 29mcg patches- you can do this!!! Try and make healthy food choices- you can do it! Call with any questions or concerns Welcome!  We're glad to have you!!!

## 2016-05-03 NOTE — Assessment & Plan Note (Addendum)
New to provider, ongoing for pt.  She is interested in quitting and was successful using the patches once before.  Determined her current use of cigarettes, <1/2 ppd and which patch level would be appropriate.  Discussed enrolling in quit assist support as well.  Refill provided.  Will follow closely.  Total time spent discussing smoking cessation ~10 minutes

## 2016-05-06 ENCOUNTER — Other Ambulatory Visit: Payer: BLUE CROSS/BLUE SHIELD

## 2016-05-11 ENCOUNTER — Other Ambulatory Visit (INDEPENDENT_AMBULATORY_CARE_PROVIDER_SITE_OTHER): Payer: BLUE CROSS/BLUE SHIELD

## 2016-05-11 DIAGNOSIS — Z0000 Encounter for general adult medical examination without abnormal findings: Secondary | ICD-10-CM | POA: Diagnosis not present

## 2016-05-11 LAB — CBC WITH DIFFERENTIAL/PLATELET
BASOS PCT: 1.9 % (ref 0.0–3.0)
Basophils Absolute: 0.1 10*3/uL (ref 0.0–0.1)
EOS ABS: 0.5 10*3/uL (ref 0.0–0.7)
Eosinophils Relative: 7 % — ABNORMAL HIGH (ref 0.0–5.0)
HEMATOCRIT: 37.1 % (ref 36.0–46.0)
Hemoglobin: 12.2 g/dL (ref 12.0–15.0)
LYMPHS ABS: 2.3 10*3/uL (ref 0.7–4.0)
LYMPHS PCT: 31.1 % (ref 12.0–46.0)
MCHC: 32.9 g/dL (ref 30.0–36.0)
MCV: 84 fl (ref 78.0–100.0)
MONO ABS: 0.7 10*3/uL (ref 0.1–1.0)
Monocytes Relative: 9.2 % (ref 3.0–12.0)
NEUTROS ABS: 3.8 10*3/uL (ref 1.4–7.7)
Neutrophils Relative %: 50.8 % (ref 43.0–77.0)
PLATELETS: 264 10*3/uL (ref 150.0–400.0)
RBC: 4.42 Mil/uL (ref 3.87–5.11)
RDW: 14.2 % (ref 11.5–15.5)
WBC: 7.4 10*3/uL (ref 4.0–10.5)

## 2016-05-11 LAB — LIPID PANEL
CHOLESTEROL: 226 mg/dL — AB (ref 0–200)
HDL: 72 mg/dL (ref >39.00)
LDL Cholesterol: 139 mg/dL — ABNORMAL HIGH (ref 0–99)
NONHDL: 154.29
Total CHOL/HDL Ratio: 3
Triglycerides: 75 mg/dL (ref 0.0–149.0)
VLDL: 15 mg/dL (ref 0.0–40.0)

## 2016-05-11 LAB — HEPATIC FUNCTION PANEL
ALK PHOS: 42 U/L (ref 39–117)
ALT: 23 U/L (ref 0–35)
AST: 24 U/L (ref 0–37)
Albumin: 4.3 g/dL (ref 3.5–5.2)
BILIRUBIN DIRECT: 0 mg/dL (ref 0.0–0.3)
BILIRUBIN TOTAL: 0.3 mg/dL (ref 0.2–1.2)
Total Protein: 7.1 g/dL (ref 6.0–8.3)

## 2016-05-11 LAB — BASIC METABOLIC PANEL
BUN: 12 mg/dL (ref 6–23)
CHLORIDE: 105 meq/L (ref 96–112)
CO2: 26 meq/L (ref 19–32)
Calcium: 9.2 mg/dL (ref 8.4–10.5)
Creatinine, Ser: 0.86 mg/dL (ref 0.40–1.20)
GFR: 75.68 mL/min (ref >60.00)
Glucose, Bld: 91 mg/dL (ref 70–99)
POTASSIUM: 4.3 meq/L (ref 3.5–5.1)
SODIUM: 137 meq/L (ref 135–145)

## 2016-05-11 LAB — TSH: TSH: 1.65 u[IU]/mL (ref 0.35–4.50)

## 2016-05-11 LAB — VITAMIN D 25 HYDROXY (VIT D DEFICIENCY, FRACTURES): VITD: 17.04 ng/mL — ABNORMAL LOW (ref 30.00–100.00)

## 2016-05-12 ENCOUNTER — Other Ambulatory Visit: Payer: Self-pay | Admitting: General Practice

## 2016-05-12 MED ORDER — VITAMIN D (ERGOCALCIFEROL) 1.25 MG (50000 UNIT) PO CAPS: 50000.0000 [IU] | ORAL_CAPSULE | ORAL | 0 refills | Status: DC

## 2016-06-08 ENCOUNTER — Encounter: Payer: Self-pay | Admitting: General Practice

## 2016-07-22 ENCOUNTER — Other Ambulatory Visit: Payer: Self-pay | Admitting: Gynecology

## 2016-07-22 DIAGNOSIS — Z1231 Encounter for screening mammogram for malignant neoplasm of breast: Secondary | ICD-10-CM

## 2016-08-01 ENCOUNTER — Other Ambulatory Visit: Payer: Self-pay | Admitting: Family Medicine

## 2016-08-09 ENCOUNTER — Ambulatory Visit
Admission: RE | Admit: 2016-08-09 | Discharge: 2016-08-09 | Disposition: A | Discharge status: Home / Self Care | Payer: BLUE CROSS/BLUE SHIELD | Source: Ambulatory Visit | Attending: Gynecology | Admitting: Gynecology

## 2016-08-09 ENCOUNTER — Other Ambulatory Visit: Payer: Self-pay | Admitting: Family Medicine

## 2016-08-09 DIAGNOSIS — Z1231 Encounter for screening mammogram for malignant neoplasm of breast: Secondary | ICD-10-CM | POA: Diagnosis not present

## 2016-09-21 ENCOUNTER — Encounter: Payer: Self-pay | Admitting: Physician Assistant

## 2016-09-21 ENCOUNTER — Ambulatory Visit (INDEPENDENT_AMBULATORY_CARE_PROVIDER_SITE_OTHER): Payer: BLUE CROSS/BLUE SHIELD | Admitting: Physician Assistant

## 2016-09-21 VITALS — BP 100/60 | HR 87 | Temp 98.1°F | Ht 64.0 in | Wt 103.4 lb

## 2016-09-21 DIAGNOSIS — J069 Acute upper respiratory infection, unspecified: Secondary | ICD-10-CM

## 2016-09-21 MED ORDER — HYDROCOD POLST-CPM POLST ER 10-8 MG/5ML PO SUER: 5.0000 mL | Freq: Every evening | ORAL | 0 refills | Status: DC | PRN

## 2016-09-21 NOTE — Progress Notes (Signed)
Natalie Jones is a 46 y.o. female here for cough and chest congestion.  I acted as a Education administrator for [employer], PA-C Anselmo Pickler, LPN  History of Present Illness:   Chief Complaint  Patient presents with  . Cough  . Chest congestion    Cough  This is a new problem. Episode onset: x 2 days. The problem has been gradually worsening. The problem occurs every few minutes. The cough is productive of purulent sputum. Pertinent negatives include no chest pain, chills, ear congestion, ear pain, fever, headaches, nasal congestion, postnasal drip, shortness of breath, sweats, weight loss or wheezing. Associated symptoms comments: Chest congestion and slight sore throat. Nothing aggravates the symptoms. She has tried OTC cough suppressant for the symptoms. The treatment provided mild relief. Hx collapsed lung 2 years ago   Congestion and just a cough. Has tried Mucinex -- just started yesterday. Is a current smoker. Appetite is fine. Denies any issues with diarrhea, constipation, nausea, vomiting. 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 She does not feel like she is well hydrated.   PMHx, SurgHx, SocialHx, Medications, and Allergies were reviewed in the Visit Navigator and updated as appropriate.  Current Medications:   Current Outpatient Prescriptions:  .  amphetamine-dextroamphetamine (ADDERALL) 20 MG tablet, Take 20 mg by mouth 3 (three) times daily. Takes at 0700, 1200, and 1600., Disp: , Rfl:  .  ibuprofen (ADVIL,MOTRIN) 200 MG tablet, Take 400 mg by mouth as needed for cramping., Disp: , Rfl:  .  chlorpheniramine-HYDROcodone (TUSSIONEX PENNKINETIC ER) 10-8 MG/5ML SUER, Take 5 mLs by mouth at bedtime as needed for cough., Disp: 60 mL, Rfl: 0 .  nicotine (NICODERM CQ - DOSED IN MG/24 HOURS) 14 mg/24hr patch, Place 1 patch (14 mg total) onto the skin daily. (Patient not taking: Reported on 09/21/2016), Disp: 28 patch, Rfl: 0 .  ondansetron (ZOFRAN ODT) 4 MG disintegrating tablet,  Take 1 tablet (4 mg total) by mouth every 8 (eight) hours as needed for nausea or vomiting. (Patient not taking: Reported on 09/21/2016), Disp: 20 tablet, Rfl: 0   Review of Systems:   Review of Systems  Constitutional: Negative for chills, fever and weight loss.  HENT: Negative for ear pain and postnasal drip.   Respiratory: Positive for cough. Negative for shortness of breath and wheezing.   Cardiovascular: Negative for chest pain.  Neurological: Negative for headaches.    Vitals:   Vitals:   09/21/16 1157  BP: 100/60  Pulse: 87  Temp: 98.1 F (36.7 C)  TempSrc: Oral  SpO2: 99%  Weight: 103 lb 6.1 oz (46.9 kg)  Height: 5\' 4"  (1.626 m)     Body mass index is 17.75 kg/m.  Physical Exam:   Physical Exam  Constitutional: She appears well-developed. She is cooperative.  Non-toxic appearance. She does not have a sickly appearance. She does not appear ill. No distress.  HENT:  Head: Normocephalic and atraumatic.  Right Ear: Tympanic membrane, external ear and ear canal normal. Tympanic membrane is not erythematous, not retracted and not bulging.  Left Ear: Tympanic membrane, external ear and ear canal normal. Tympanic membrane is not erythematous, not retracted and not bulging.  Nose: Nose normal. Right sinus exhibits no maxillary sinus tenderness and no frontal sinus tenderness. Left sinus exhibits no maxillary sinus tenderness and no frontal sinus tenderness.  Mouth/Throat: Uvula is midline. No posterior oropharyngeal edema or posterior oropharyngeal erythema.  Eyes: Conjunctivae and lids are normal.  Neck: Trachea normal.  Cardiovascular: Normal rate,  regular rhythm, S1 normal, S2 normal and normal heart sounds.   Pulmonary/Chest: Effort normal and breath sounds normal. She has no decreased breath sounds. She has no wheezes. She has no rhonchi. She has no rales.  Lymphadenopathy:    She has no cervical adenopathy.  Neurological: She is alert.  Skin: Skin is warm, dry and  intact.  Psychiatric: She has a normal mood and affect. Her speech is normal and behavior is normal.  Nursing note and vitals reviewed.     Assessment and Plan:    Morayo was seen today for cough and chest congestion.  Diagnoses and all orders for this visit:  Upper respiratory tract infection, unspecified type  Other orders -     chlorpheniramine-HYDROcodone (TUSSIONEX PENNKINETIC ER) 10-8 MG/5ML SUER; Take 5 mLs by mouth at bedtime as needed for cough.    Patient with upper respiratory infection, I suspect that this is viral in nature. She does not meet indications for antibiotics at this time. We discussed hydration, rest, continue the Mucinex and taking Tussionex as needed for cough. I recommended the patient that if she develops fever, shortness of breath, or any other acute concerns that she needs to be followed up. Patient is agreeable to plan.   . Reviewed expectations re: course of current medical issues. . Discussed self-management of symptoms. . Outlined signs and symptoms indicating need for more acute intervention. . Patient verbalized understanding and all questions were answered. . See orders for this visit as documented in the electronic medical record. . Patient received an After-Visit Summary.  CMA or LPN served as scribe during this visit. History, Physical, and Plan performed by medical provider. Documentation and orders reviewed and attested to.  Inda Coke, PA-C

## 2016-09-21 NOTE — Patient Instructions (Signed)
   Continue Mucinex and start Tussionex as needed for cough. Follow-up if worsening shortness of breath or fever.

## 2016-09-22 ENCOUNTER — Other Ambulatory Visit: Payer: Self-pay | Admitting: Physician Assistant

## 2016-09-22 ENCOUNTER — Encounter: Payer: Self-pay | Admitting: Physician Assistant

## 2016-09-22 MED ORDER — PREDNISONE 20 MG PO TABS: 20.0000 mg | ORAL_TABLET | Freq: Every day | ORAL | 0 refills | Status: DC

## 2016-09-24 ENCOUNTER — Other Ambulatory Visit: Payer: Self-pay | Admitting: Physician Assistant

## 2016-09-24 MED ORDER — DOXYCYCLINE HYCLATE 100 MG PO TABS: 100.0000 mg | ORAL_TABLET | Freq: Two times a day (BID) | ORAL | 0 refills | Status: DC

## 2016-09-24 NOTE — Telephone Encounter (Signed)
Please see message and advise 

## 2016-09-29 ENCOUNTER — Encounter: Payer: Self-pay | Admitting: Physician Assistant

## 2016-09-30 MED ORDER — HYDROCOD POLST-CPM POLST ER 10-8 MG/5ML PO SUER: 5.0000 mL | Freq: Every evening | ORAL | 0 refills | Status: DC | PRN

## 2016-09-30 NOTE — Telephone Encounter (Signed)
Patient returning phone call. Advised patient of note below. No further action required, patient will come pick up rx when she can.

## 2016-09-30 NOTE — Telephone Encounter (Signed)
Left message on voicemail to call office. Rx for Tussionex printed and signed by Aldona Bar and put at the front desk for pickup.

## 2016-10-25 DIAGNOSIS — F900 Attention-deficit hyperactivity disorder, predominantly inattentive type: Secondary | ICD-10-CM | POA: Diagnosis not present

## 2017-05-02 DIAGNOSIS — F900 Attention-deficit hyperactivity disorder, predominantly inattentive type: Secondary | ICD-10-CM | POA: Diagnosis not present

## 2017-06-28 DIAGNOSIS — Z1389 Encounter for screening for other disorder: Secondary | ICD-10-CM | POA: Diagnosis not present

## 2017-06-28 DIAGNOSIS — Z01419 Encounter for gynecological examination (general) (routine) without abnormal findings: Secondary | ICD-10-CM | POA: Diagnosis not present

## 2017-06-28 DIAGNOSIS — Z681 Body mass index (BMI) 19 or less, adult: Secondary | ICD-10-CM | POA: Diagnosis not present

## 2017-06-28 DIAGNOSIS — Z130 Encounter for screening for diseases of the blood and blood-forming organs and certain disorders involving the immune mechanism: Secondary | ICD-10-CM | POA: Diagnosis not present

## 2017-07-01 ENCOUNTER — Other Ambulatory Visit: Payer: Self-pay | Admitting: Gynecology

## 2017-07-01 DIAGNOSIS — Z1231 Encounter for screening mammogram for malignant neoplasm of breast: Secondary | ICD-10-CM

## 2017-08-19 ENCOUNTER — Other Ambulatory Visit: Payer: Self-pay

## 2017-08-19 ENCOUNTER — Ambulatory Visit: Payer: BLUE CROSS/BLUE SHIELD | Admitting: Family Medicine

## 2017-08-19 ENCOUNTER — Encounter: Payer: Self-pay | Admitting: Family Medicine

## 2017-08-19 VITALS — BP 102/68 | HR 89 | Temp 98.4°F | Resp 16 | Ht 64.0 in | Wt 101.1 lb

## 2017-08-19 DIAGNOSIS — E559 Vitamin D deficiency, unspecified: Secondary | ICD-10-CM | POA: Diagnosis not present

## 2017-08-19 DIAGNOSIS — Z23 Encounter for immunization: Secondary | ICD-10-CM

## 2017-08-19 DIAGNOSIS — Z0000 Encounter for general adult medical examination without abnormal findings: Secondary | ICD-10-CM | POA: Diagnosis not present

## 2017-08-19 DIAGNOSIS — E785 Hyperlipidemia, unspecified: Secondary | ICD-10-CM | POA: Diagnosis not present

## 2017-08-19 LAB — BASIC METABOLIC PANEL
BUN: 11 mg/dL (ref 6–23)
CALCIUM: 9.4 mg/dL (ref 8.4–10.5)
CHLORIDE: 104 meq/L (ref 96–112)
CO2: 26 meq/L (ref 19–32)
Creatinine, Ser: 0.82 mg/dL (ref 0.40–1.20)
GFR: 79.51 mL/min (ref >60.00)
GLUCOSE: 83 mg/dL (ref 70–99)
Potassium: 3.9 mEq/L (ref 3.5–5.1)
SODIUM: 138 meq/L (ref 135–145)

## 2017-08-19 LAB — CBC WITH DIFFERENTIAL/PLATELET
BASOS ABS: 0 10*3/uL (ref 0.0–0.1)
BASOS PCT: 0.7 % (ref 0.0–3.0)
Eosinophils Absolute: 0.6 10*3/uL (ref 0.0–0.7)
Eosinophils Relative: 9.7 % — ABNORMAL HIGH (ref 0.0–5.0)
HEMATOCRIT: 36.9 % (ref 36.0–46.0)
Hemoglobin: 12 g/dL (ref 12.0–15.0)
LYMPHS ABS: 2.1 10*3/uL (ref 0.7–4.0)
Lymphocytes Relative: 33.9 % (ref 12.0–46.0)
MCHC: 32.7 g/dL (ref 30.0–36.0)
MCV: 84.9 fl (ref 78.0–100.0)
MONO ABS: 0.5 10*3/uL (ref 0.1–1.0)
Monocytes Relative: 8.7 % (ref 3.0–12.0)
NEUTROS PCT: 47 % (ref 43.0–77.0)
Neutro Abs: 2.8 10*3/uL (ref 1.4–7.7)
PLATELETS: 249 10*3/uL (ref 150.0–400.0)
RBC: 4.34 Mil/uL (ref 3.87–5.11)
RDW: 13.9 % (ref 11.5–15.5)
WBC: 6.1 10*3/uL (ref 4.0–10.5)

## 2017-08-19 LAB — HEPATIC FUNCTION PANEL
ALK PHOS: 45 U/L (ref 39–117)
ALT: 13 U/L (ref 0–35)
AST: 17 U/L (ref 0–37)
Albumin: 4.4 g/dL (ref 3.5–5.2)
BILIRUBIN DIRECT: 0.1 mg/dL (ref 0.0–0.3)
BILIRUBIN TOTAL: 0.4 mg/dL (ref 0.2–1.2)
TOTAL PROTEIN: 7 g/dL (ref 6.0–8.3)

## 2017-08-19 LAB — LIPID PANEL
CHOLESTEROL: 200 mg/dL (ref 0–200)
HDL: 70 mg/dL (ref >39.00)
LDL Cholesterol: 116 mg/dL — ABNORMAL HIGH (ref 0–99)
NONHDL: 129.63
Total CHOL/HDL Ratio: 3
Triglycerides: 68 mg/dL (ref 0.0–149.0)
VLDL: 13.6 mg/dL (ref 0.0–40.0)

## 2017-08-19 LAB — TSH: TSH: 1.01 u[IU]/mL (ref 0.35–4.50)

## 2017-08-19 LAB — VITAMIN D 25 HYDROXY (VIT D DEFICIENCY, FRACTURES): VITD: 32.07 ng/mL (ref 30.00–100.00)

## 2017-08-19 NOTE — Addendum Note (Signed)
Addended by: Davis Gourd on: 08/19/2017 08:47 AM   Modules accepted: Orders

## 2017-08-19 NOTE — Progress Notes (Signed)
   Subjective:    Patient ID: Natalie Jones, female    DOB: 1970/12/15, 47 y.o.   MRN: [id number]  HPI CPE- due for GYN appt (scheduled w/ Eve Key).  Due for Tdap.   Review of Systems Patient reports no vision/ hearing changes, adenopathy,fever, weight change,  persistant/recurrent hoarseness , swallowing issues, chest pain, palpitations, edema, persistant/recurrent cough, hemoptysis, dyspnea (rest/exertional/paroxysmal nocturnal), gastrointestinal bleeding (melena, rectal bleeding), abdominal pain, significant heartburn, bowel changes, GU symptoms (dysuria, hematuria, incontinence), Gyn symptoms (abnormal  bleeding, pain),  syncope, focal weakness, memory loss, numbness & tingling, skin/hair/nail changes, abnormal bruising or bleeding, anxiety, or depression.     Objective:   Physical Exam General Appearance:    Alert, cooperative, no distress, appears stated age  Head:    Normocephalic, without obvious abnormality, atraumatic  Eyes:    PERRL, conjunctiva/corneas clear, EOM's intact, fundi    benign, both eyes  Ears:    Normal TM's and external ear canals, both ears  Nose:   Nares normal, septum midline, mucosa normal, no drainage    or sinus tenderness  Throat:   Lips, mucosa, and tongue normal; teeth and gums normal  Neck:   Supple, symmetrical, trachea midline, no adenopathy;    Thyroid: no enlargement/tenderness/nodules  Back:     Symmetric, no curvature, ROM normal, no CVA tenderness  Lungs:     Clear to auscultation bilaterally, respirations unlabored  Chest Wall:    No tenderness or deformity   Heart:    Regular rate and rhythm, S1 and S2 normal, no murmur, rub   or gallop  Breast Exam:    Deferred to GYN  Abdomen:     Soft, non-tender, bowel sounds active all four quadrants,    no masses, no organomegaly  Genitalia:    Deferred to GYN  Rectal:    Extremities:   Extremities normal, atraumatic, no cyanosis or edema  Pulses:   2+ and symmetric all extremities  Skin:   Skin color,  texture, turgor normal, no rashes or lesions  Lymph nodes:   Cervical, supraclavicular, and axillary nodes normal  Neurologic:   CNII-XII intact, normal strength, sensation and reflexes    throughout          Assessment & Plan:

## 2017-08-19 NOTE — Patient Instructions (Addendum)
Follow up in 1 year or as needed We'll notify you of your lab results and make any changes if needed Make sure you are eating regularly and exercising as able Call with any questions or concerns Happy Spring!!!

## 2017-08-19 NOTE — Assessment & Plan Note (Signed)
Pt has hx of this.  Attempting to control w/ diet.  Check labs.  Start meds prn.

## 2017-08-19 NOTE — Assessment & Plan Note (Signed)
Pt w/ hx of this.  Check labs and replete prn.

## 2017-08-19 NOTE — Assessment & Plan Note (Signed)
Pt's PE WNL.  Has GYN scheduled.  Tdap updated today.  Check labs.  Stressed need for smoking cessation.  Anticipatory guidance provided.

## 2017-08-22 ENCOUNTER — Encounter: Payer: Self-pay | Admitting: General Practice

## 2017-09-05 ENCOUNTER — Ambulatory Visit
Admission: RE | Admit: 2017-09-05 | Discharge: 2017-09-05 | Disposition: A | Discharge status: Home / Self Care | Payer: BLUE CROSS/BLUE SHIELD | Source: Ambulatory Visit | Attending: Gynecology | Admitting: Gynecology

## 2017-09-05 DIAGNOSIS — Z1231 Encounter for screening mammogram for malignant neoplasm of breast: Secondary | ICD-10-CM

## 2017-10-24 DIAGNOSIS — F900 Attention-deficit hyperactivity disorder, predominantly inattentive type: Secondary | ICD-10-CM | POA: Diagnosis not present

## 2017-12-22 ENCOUNTER — Encounter: Payer: BLUE CROSS/BLUE SHIELD | Admitting: Family Medicine

## 2017-12-22 ENCOUNTER — Encounter

## 2018-04-24 DIAGNOSIS — F900 Attention-deficit hyperactivity disorder, predominantly inattentive type: Secondary | ICD-10-CM | POA: Diagnosis not present

## 2018-08-21 ENCOUNTER — Encounter: Payer: BLUE CROSS/BLUE SHIELD | Admitting: Family Medicine

## 2018-10-16 DIAGNOSIS — F900 Attention-deficit hyperactivity disorder, predominantly inattentive type: Secondary | ICD-10-CM | POA: Diagnosis not present

## 2018-12-26 ENCOUNTER — Encounter: Payer: BLUE CROSS/BLUE SHIELD | Admitting: Family Medicine

## 2019-01-02 ENCOUNTER — Other Ambulatory Visit: Payer: Self-pay | Admitting: Gynecology

## 2019-01-02 DIAGNOSIS — Z1231 Encounter for screening mammogram for malignant neoplasm of breast: Secondary | ICD-10-CM

## 2019-01-11 ENCOUNTER — Telehealth: Payer: Self-pay | Admitting: *Deleted

## 2019-01-11 DIAGNOSIS — E559 Vitamin D deficiency, unspecified: Secondary | ICD-10-CM

## 2019-01-11 DIAGNOSIS — E785 Hyperlipidemia, unspecified: Secondary | ICD-10-CM

## 2019-01-11 NOTE — Telephone Encounter (Signed)
Patient is asking for labs to be order prior to her 9/29 CPE.  She is asking for the same exact labs as last year. Aware that provider is out of the office today but that we will call her when we have an answer.

## 2019-01-12 NOTE — Telephone Encounter (Signed)
Labs ordered and pt made aware.

## 2019-01-12 NOTE — Addendum Note (Signed)
Addended by: Davis Gourd on: 01/12/2019 08:31 AM   Modules accepted: Orders

## 2019-01-12 NOTE — Telephone Encounter (Signed)
[place] for last year's labs (CBC w/ diff, TSH, BMP, LFTs, lipids- dx hyperlipidemia and Vit D- dx Vit D deficiency

## 2019-01-15 ENCOUNTER — Telehealth: Payer: Self-pay

## 2019-01-15 ENCOUNTER — Ambulatory Visit (INDEPENDENT_AMBULATORY_CARE_PROVIDER_SITE_OTHER): Payer: BC Managed Care – PPO

## 2019-01-15 ENCOUNTER — Other Ambulatory Visit: Payer: Self-pay

## 2019-01-15 DIAGNOSIS — E559 Vitamin D deficiency, unspecified: Secondary | ICD-10-CM | POA: Diagnosis not present

## 2019-01-15 DIAGNOSIS — E785 Hyperlipidemia, unspecified: Secondary | ICD-10-CM

## 2019-01-15 LAB — CBC WITH DIFFERENTIAL/PLATELET
Basophils Absolute: 0.1 10*3/uL (ref 0.0–0.1)
Basophils Relative: 0.7 % (ref 0.0–3.0)
Eosinophils Absolute: 0.6 10*3/uL (ref 0.0–0.7)
Eosinophils Relative: 7.9 % — ABNORMAL HIGH (ref 0.0–5.0)
HCT: 36.4 % (ref 36.0–46.0)
Hemoglobin: 12 g/dL (ref 12.0–15.0)
Lymphocytes Relative: 24.2 % (ref 12.0–46.0)
Lymphs Abs: 1.9 10*3/uL (ref 0.7–4.0)
MCHC: 32.9 g/dL (ref 30.0–36.0)
MCV: 85.2 fl (ref 78.0–100.0)
Monocytes Absolute: 0.8 10*3/uL (ref 0.1–1.0)
Monocytes Relative: 9.6 % (ref 3.0–12.0)
Neutro Abs: 4.5 10*3/uL (ref 1.4–7.7)
Neutrophils Relative %: 57.6 % (ref 43.0–77.0)
Platelets: 230 10*3/uL (ref 150.0–400.0)
RBC: 4.27 Mil/uL (ref 3.87–5.11)
RDW: 13.4 % (ref 11.5–15.5)
WBC: 7.9 10*3/uL (ref 4.0–10.5)

## 2019-01-15 LAB — LIPID PANEL
Cholesterol: 192 mg/dL (ref 0–200)
HDL: 65.9 mg/dL (ref >39.00)
LDL Cholesterol: 115 mg/dL — ABNORMAL HIGH (ref 0–99)
NonHDL: 126.04
Total CHOL/HDL Ratio: 3
Triglycerides: 56 mg/dL (ref 0.0–149.0)
VLDL: 11.2 mg/dL (ref 0.0–40.0)

## 2019-01-15 LAB — HEPATIC FUNCTION PANEL
ALT: 14 U/L (ref 0–35)
AST: 15 U/L (ref 0–37)
Albumin: 4.2 g/dL (ref 3.5–5.2)
Alkaline Phosphatase: 43 U/L (ref 39–117)
Bilirubin, Direct: 0 mg/dL (ref 0.0–0.3)
Total Bilirubin: 0.3 mg/dL (ref 0.2–1.2)
Total Protein: 6.8 g/dL (ref 6.0–8.3)

## 2019-01-15 LAB — BASIC METABOLIC PANEL
BUN: 10 mg/dL (ref 6–23)
CO2: 29 mEq/L (ref 19–32)
Calcium: 9.4 mg/dL (ref 8.4–10.5)
Chloride: 104 mEq/L (ref 96–112)
Creatinine, Ser: 0.74 mg/dL (ref 0.40–1.20)
GFR: 83.71 mL/min (ref >60.00)
Glucose, Bld: 83 mg/dL (ref 70–99)
Potassium: 4 mEq/L (ref 3.5–5.1)
Sodium: 140 mEq/L (ref 135–145)

## 2019-01-15 LAB — TSH: TSH: 1.53 u[IU]/mL (ref 0.35–4.50)

## 2019-01-15 LAB — VITAMIN D 25 HYDROXY (VIT D DEFICIENCY, FRACTURES): VITD: 39.88 ng/mL (ref 30.00–100.00)

## 2019-01-15 NOTE — Telephone Encounter (Signed)
Patient seen today for her flu shot. Wanted to know if Dr. Birdie Riddle thought it was OK to start her pneumonia vaccine. Stated she has had a collapsed lung previously. I informed patient that I would route PCP a message and they could discuss at her visit tomorrow.

## 2019-01-15 NOTE — Telephone Encounter (Signed)
Will discuss w/ pt tomorrow

## 2019-01-16 ENCOUNTER — Encounter: Payer: Self-pay | Admitting: Family Medicine

## 2019-01-16 ENCOUNTER — Ambulatory Visit (INDEPENDENT_AMBULATORY_CARE_PROVIDER_SITE_OTHER): Payer: BC Managed Care – PPO | Admitting: Family Medicine

## 2019-01-16 VITALS — BP 120/73 | HR 80 | Temp 97.9°F | Resp 17 | Ht 64.0 in | Wt 103.2 lb

## 2019-01-16 DIAGNOSIS — Z23 Encounter for immunization: Secondary | ICD-10-CM | POA: Diagnosis not present

## 2019-01-16 DIAGNOSIS — Z720 Tobacco use: Secondary | ICD-10-CM | POA: Diagnosis not present

## 2019-01-16 DIAGNOSIS — Z0000 Encounter for general adult medical examination without abnormal findings: Secondary | ICD-10-CM

## 2019-01-16 NOTE — Progress Notes (Signed)
   Subjective:    Patient ID: Natalie Jones, female    DOB: 1970/09/26, 48 y.o.   MRN: [id number]  HPI CPE- Due for pap and mammo (pt to schedule w/ GYN)  UTD on flu and Tdap.  Reviewed recent labs- look good.  Due to current smoking, will do Pneumovax today.   Review of Systems Patient reports no vision/ hearing changes, adenopathy,fever, weight change,  persistant/recurrent hoarseness , swallowing issues, chest pain, palpitations, edema, persistant/recurrent cough, hemoptysis, dyspnea (rest/exertional/paroxysmal nocturnal), gastrointestinal bleeding (melena, rectal bleeding), abdominal pain, significant heartburn, bowel changes, GU symptoms (dysuria, hematuria, incontinence), Gyn symptoms (abnormal  bleeding, pain),  syncope, focal weakness, memory loss, numbness & tingling, skin/hair/nail changes, abnormal bruising or bleeding, anxiety, or depression.     Objective:   Physical Exam General Appearance:    Alert, cooperative, no distress, appears stated age  Head:    Normocephalic, without obvious abnormality, atraumatic  Eyes:    PERRL, conjunctiva/corneas clear, EOM's intact, fundi    benign, both eyes  Ears:    Normal TM's and external ear canals, both ears  Nose:   Deferred due to COVID  Throat:   Neck:   Supple, symmetrical, trachea midline, no adenopathy;    Thyroid: no enlargement/tenderness/nodules  Back:     Symmetric, no curvature, ROM normal, no CVA tenderness  Lungs:     Clear to auscultation bilaterally, respirations unlabored  Chest Wall:    No tenderness or deformity   Heart:    Regular rate and rhythm, S1 and S2 normal, no murmur, rub   or gallop  Breast Exam:    Deferred to GYN  Abdomen:     Soft, non-tender, bowel sounds active all four quadrants,    no masses, no organomegaly  Genitalia:    Deferred to GYN  Rectal:    Extremities:   Extremities normal, atraumatic, no cyanosis or edema  Pulses:   2+ and symmetric all extremities  Skin:   Skin color, texture, turgor  normal, no rashes or lesions  Lymph nodes:   Cervical, supraclavicular, and axillary nodes normal  Neurologic:   CNII-XII intact, normal strength, sensation and reflexes    throughout          Assessment & Plan:

## 2019-01-16 NOTE — Assessment & Plan Note (Signed)
Pt's PE WNL.  Reviewed recent labs.  Due for pap and mammo- pt to schedule w/ GYN.  Pneumovax given today.  UTD on flu.  Anticipatory guidance provided.

## 2019-01-16 NOTE — Assessment & Plan Note (Signed)
Again encouraged pt to quit smoking.  She is not ready or willing at this time.  Due to smoking hx, will give Pneumovax today.  Pt expressed understanding and is in agreement w/ plan.

## 2019-01-16 NOTE — Progress Notes (Signed)
Natalie Jones 48 y.o. female presents to office today for CPE. Per Annye Asa, MD administered PNEUMOVAX 0.5 mL IM right arm. Patient tolerated well.

## 2019-01-16 NOTE — Addendum Note (Signed)
Addended by: Doran Clay A on: 01/16/2019 12:22 PM   Modules accepted: Orders

## 2019-01-16 NOTE — Patient Instructions (Signed)
Follow up in 1 year or as needed Your labs look great! Make sure you are eating regularly- you don't want to lose any more weight Call and schedule your pap and mammo Call with any questions or concerns Stay Safe!!  Stay Sane!!

## 2019-02-15 ENCOUNTER — Ambulatory Visit
Admission: RE | Admit: 2019-02-15 | Discharge: 2019-02-15 | Disposition: A | Discharge status: Home / Self Care | Payer: BC Managed Care – PPO | Source: Ambulatory Visit | Attending: Gynecology | Admitting: Gynecology

## 2019-02-15 ENCOUNTER — Other Ambulatory Visit: Payer: Self-pay

## 2019-02-15 DIAGNOSIS — Z1231 Encounter for screening mammogram for malignant neoplasm of breast: Secondary | ICD-10-CM | POA: Diagnosis not present

## 2019-04-07 DIAGNOSIS — F900 Attention-deficit hyperactivity disorder, predominantly inattentive type: Secondary | ICD-10-CM | POA: Diagnosis not present

## 2019-07-05 ENCOUNTER — Ambulatory Visit: Payer: BC Managed Care – PPO

## 2019-07-06 ENCOUNTER — Ambulatory Visit: Payer: BC Managed Care – PPO | Attending: Internal Medicine

## 2019-07-06 DIAGNOSIS — Z23 Encounter for immunization: Secondary | ICD-10-CM

## 2019-07-06 NOTE — Progress Notes (Signed)
   Covid-19 Vaccination Clinic  Name:  Natalie Jones    MRN: [id number] DOB: 23-Feb-1971  07/06/2019  [name] was observed post Covid-19 immunization for 15 minutes without incident. She was provided with Vaccine Information Sheet and instruction to access the V-Safe system.   [name] was instructed to call 911 with any severe reactions post vaccine: Marland Kitchen Difficulty breathing  . Swelling of face and throat  . A fast heartbeat  . A bad rash all over body  . Dizziness and weakness   Immunizations Administered    Name Date Dose VIS Date Route   Pfizer COVID-19 Vaccine 07/06/2019  2:35 PM 0.3 mL 03/30/2019 Intramuscular   Manufacturer: [place]   Lot: [id number]   [place]: [id number]

## 2019-07-31 ENCOUNTER — Ambulatory Visit: Payer: BC Managed Care – PPO | Attending: Internal Medicine

## 2019-07-31 DIAGNOSIS — Z23 Encounter for immunization: Secondary | ICD-10-CM

## 2019-07-31 NOTE — Progress Notes (Signed)
   Covid-19 Vaccination Clinic  Name:  Heiress Matsushita    MRN: [id number] DOB: Aug 10, 1970  07/31/2019  Ms. Walling was observed post Covid-19 immunization for 15 minutes without incident. She was provided with Vaccine Information Sheet and instruction to access the V-Safe system.   Ms. Mousel was instructed to call 911 with any severe reactions post vaccine: Marland Kitchen Difficulty breathing  . Swelling of face and throat  . A fast heartbeat  . A bad rash all over body  . Dizziness and weakness   Immunizations Administered    Name Date Dose VIS Date Route   Pfizer COVID-19 Vaccine 07/31/2019  3:02 PM 0.3 mL 03/30/2019 Intramuscular   Manufacturer: [place]   Lot: [id number]   [place]: [id number]

## 2019-09-14 DIAGNOSIS — F900 Attention-deficit hyperactivity disorder, predominantly inattentive type: Secondary | ICD-10-CM | POA: Diagnosis not present

## 2019-11-27 DIAGNOSIS — Z01419 Encounter for gynecological examination (general) (routine) without abnormal findings: Secondary | ICD-10-CM | POA: Diagnosis not present

## 2019-11-27 DIAGNOSIS — Z124 Encounter for screening for malignant neoplasm of cervix: Secondary | ICD-10-CM | POA: Diagnosis not present

## 2019-11-27 DIAGNOSIS — Z1151 Encounter for screening for human papillomavirus (HPV): Secondary | ICD-10-CM | POA: Diagnosis not present

## 2019-11-27 DIAGNOSIS — Z130 Encounter for screening for diseases of the blood and blood-forming organs and certain disorders involving the immune mechanism: Secondary | ICD-10-CM | POA: Diagnosis not present

## 2019-11-27 DIAGNOSIS — Z681 Body mass index (BMI) 19 or less, adult: Secondary | ICD-10-CM | POA: Diagnosis not present

## 2019-11-27 LAB — HM PAP SMEAR: HM Pap smear: NORMAL

## 2019-11-27 LAB — RESULTS CONSOLE HPV: CHL HPV: NEGATIVE

## 2020-01-02 ENCOUNTER — Encounter: Payer: Self-pay | Admitting: General Practice

## 2020-01-17 ENCOUNTER — Encounter: Payer: BC Managed Care – PPO | Admitting: Family Medicine

## 2020-01-24 ENCOUNTER — Telehealth: Payer: Self-pay | Admitting: Family Medicine

## 2020-01-24 DIAGNOSIS — E559 Vitamin D deficiency, unspecified: Secondary | ICD-10-CM

## 2020-01-24 DIAGNOSIS — E785 Hyperlipidemia, unspecified: Secondary | ICD-10-CM

## 2020-01-24 NOTE — Telephone Encounter (Signed)
[place] for lab visit: Dx hyperlipidemia- CBC w/ diff BMP LFTs Lipid panel TSH  Dx Vit D deficiency Vit D

## 2020-01-24 NOTE — Telephone Encounter (Signed)
LVM for patient to call to schedule lab work

## 2020-01-24 NOTE — Telephone Encounter (Signed)
Please advise 

## 2020-01-24 NOTE — Telephone Encounter (Signed)
Pt called in asking if she can have her bw done before her cpe on Monday. Please advise pt can be reached at the home #

## 2020-01-24 NOTE — Telephone Encounter (Signed)
Orders placed. Ok to schedule pt a nurse visit for labs.

## 2020-01-25 ENCOUNTER — Other Ambulatory Visit: Payer: Self-pay

## 2020-01-25 ENCOUNTER — Ambulatory Visit (INDEPENDENT_AMBULATORY_CARE_PROVIDER_SITE_OTHER): Payer: BC Managed Care – PPO

## 2020-01-25 DIAGNOSIS — E785 Hyperlipidemia, unspecified: Secondary | ICD-10-CM | POA: Diagnosis not present

## 2020-01-25 DIAGNOSIS — E559 Vitamin D deficiency, unspecified: Secondary | ICD-10-CM | POA: Diagnosis not present

## 2020-01-25 LAB — CBC WITH DIFFERENTIAL/PLATELET
Basophils Absolute: 0.1 10*3/uL (ref 0.0–0.1)
Basophils Relative: 0.9 % (ref 0.0–3.0)
Eosinophils Absolute: 0.7 10*3/uL (ref 0.0–0.7)
Eosinophils Relative: 11.2 % — ABNORMAL HIGH (ref 0.0–5.0)
HCT: 37.4 % (ref 36.0–46.0)
Hemoglobin: 12 g/dL (ref 12.0–15.0)
Lymphocytes Relative: 33.1 % (ref 12.0–46.0)
Lymphs Abs: 2.1 10*3/uL (ref 0.7–4.0)
MCHC: 32.2 g/dL (ref 30.0–36.0)
MCV: 85.2 fl (ref 78.0–100.0)
Monocytes Absolute: 0.5 10*3/uL (ref 0.1–1.0)
Monocytes Relative: 8.2 % (ref 3.0–12.0)
Neutro Abs: 3 10*3/uL (ref 1.4–7.7)
Neutrophils Relative %: 46.6 % (ref 43.0–77.0)
Platelets: 233 10*3/uL (ref 150.0–400.0)
RBC: 4.39 Mil/uL (ref 3.87–5.11)
RDW: 13.4 % (ref 11.5–15.5)
WBC: 6.5 10*3/uL (ref 4.0–10.5)

## 2020-01-25 LAB — LIPID PANEL
Cholesterol: 230 mg/dL — ABNORMAL HIGH (ref 0–200)
HDL: 66.4 mg/dL (ref >39.00)
LDL Cholesterol: 148 mg/dL — ABNORMAL HIGH (ref 0–99)
NonHDL: 163.21
Total CHOL/HDL Ratio: 3
Triglycerides: 78 mg/dL (ref 0.0–149.0)
VLDL: 15.6 mg/dL (ref 0.0–40.0)

## 2020-01-25 LAB — BASIC METABOLIC PANEL
BUN: 13 mg/dL (ref 6–23)
CO2: 30 mEq/L (ref 19–32)
Calcium: 9.4 mg/dL (ref 8.4–10.5)
Chloride: 102 mEq/L (ref 96–112)
Creatinine, Ser: 0.86 mg/dL (ref 0.40–1.20)
GFR: 79.22 mL/min (ref >60.00)
Glucose, Bld: 94 mg/dL (ref 70–99)
Potassium: 3.8 mEq/L (ref 3.5–5.1)
Sodium: 139 mEq/L (ref 135–145)

## 2020-01-25 LAB — HEPATIC FUNCTION PANEL
ALT: 15 U/L (ref 0–35)
AST: 19 U/L (ref 0–37)
Albumin: 4.4 g/dL (ref 3.5–5.2)
Alkaline Phosphatase: 43 U/L (ref 39–117)
Bilirubin, Direct: 0.1 mg/dL (ref 0.0–0.3)
Total Bilirubin: 0.6 mg/dL (ref 0.2–1.2)
Total Protein: 7.2 g/dL (ref 6.0–8.3)

## 2020-01-25 LAB — TSH: TSH: 1.32 u[IU]/mL (ref 0.35–4.50)

## 2020-01-25 LAB — VITAMIN D 25 HYDROXY (VIT D DEFICIENCY, FRACTURES): VITD: 73.58 ng/mL (ref 30.00–100.00)

## 2020-01-28 ENCOUNTER — Other Ambulatory Visit: Payer: Self-pay

## 2020-01-28 ENCOUNTER — Encounter: Payer: Self-pay | Admitting: Family Medicine

## 2020-01-28 ENCOUNTER — Ambulatory Visit (INDEPENDENT_AMBULATORY_CARE_PROVIDER_SITE_OTHER): Payer: BC Managed Care – PPO | Admitting: Family Medicine

## 2020-01-28 VITALS — BP 116/68 | HR 96 | Temp 97.8°F | Resp 16 | Ht 64.0 in | Wt 101.4 lb

## 2020-01-28 DIAGNOSIS — Z1211 Encounter for screening for malignant neoplasm of colon: Secondary | ICD-10-CM

## 2020-01-28 DIAGNOSIS — Z0000 Encounter for general adult medical examination without abnormal findings: Secondary | ICD-10-CM | POA: Diagnosis not present

## 2020-01-28 DIAGNOSIS — Z23 Encounter for immunization: Secondary | ICD-10-CM | POA: Diagnosis not present

## 2020-01-28 NOTE — Progress Notes (Signed)
   Subjective:    Patient ID: Natalie Jones, female    DOB: [birth year], 49 y.o.   MRN: [id number]  HPI CPE- UTD on COVID vaccines, mammogram, pap.  Due for colon cancer screen.  Reviewed past medical, surgical, family and social histories.   Health Maintenance  Topic Date Due  . INFLUENZA VACCINE  11/18/2019  . Hepatitis C Screening  01/27/2021 (Originally May 21, 1970)  . HIV Screening  01/27/2021 (Originally 11/19/1985)  . PAP SMEAR-Modifier  11/27/2022  . TETANUS/TDAP  08/20/2027  . COVID-19 Vaccine  Completed     Patient Care Team    Relationship Specialty Notifications Start End  Midge Minium, MD PCP - General Family Medicine  05/03/16   Key, Nelia Shi, NP Nurse Practitioner Gynecology  08/19/17       Review of Systems Patient reports no vision/ hearing changes, adenopathy,fever, weight change,  persistant/recurrent hoarseness , swallowing issues, chest pain, palpitations, edema, persistant/recurrent cough, hemoptysis, dyspnea (rest/exertional/paroxysmal nocturnal), gastrointestinal bleeding (melena, rectal bleeding), abdominal pain, significant heartburn, bowel changes, GU symptoms (dysuria, hematuria, incontinence), Gyn symptoms (abnormal  bleeding, pain),  syncope, focal weakness, memory loss, numbness & tingling, skin/hair/nail changes, abnormal bruising or bleeding, anxiety, or depression.   This visit occurred during the SARS-CoV-2 public health emergency.  Safety protocols were in place, including screening questions prior to the visit, additional usage of staff PPE, and extensive cleaning of exam room while observing appropriate contact time as indicated for disinfecting solutions.       Objective:   Physical Exam General Appearance:    Alert, cooperative, no distress, appears stated age  Head:    Normocephalic, without obvious abnormality, atraumatic  Eyes:    PERRL, conjunctiva/corneas clear, EOM's intact, fundi    benign, both eyes  Ears:    Normal TM's and external ear  canals, both ears  Nose:   Deferred due to COVID  Throat:   Neck:   Supple, symmetrical, trachea midline, no adenopathy;    Thyroid: no enlargement/tenderness/nodules  Back:     Symmetric, no curvature, ROM normal, no CVA tenderness  Lungs:     Clear to auscultation bilaterally, respirations unlabored  Chest Wall:    No tenderness or deformity   Heart:    Regular rate and rhythm, S1 and S2 normal, no murmur, rub   or gallop  Breast Exam:    Deferred to GYN  Abdomen:     Soft, non-tender, bowel sounds active all four quadrants,    no masses, no organomegaly  Genitalia:    Deferred to GYN  Rectal:    Extremities:   Extremities normal, atraumatic, no cyanosis or edema  Pulses:   2+ and symmetric all extremities  Skin:   Skin color, texture, turgor normal, no rashes or lesions  Lymph nodes:   Cervical, supraclavicular, and axillary nodes normal  Neurologic:   CNII-XII intact, normal strength, sensation and reflexes    throughout          Assessment & Plan:

## 2020-01-28 NOTE — Assessment & Plan Note (Signed)
Pt's PE WNL w/ exception of low BMI.  UTD on Tdap, mammo, pap.  Due for colonoscopy- referral placed to GI.  Reviewed recent labs w/ pt.  She will start [place] and we will repeat in 6 months.  Anticipatory guidance provided.

## 2020-01-28 NOTE — Patient Instructions (Addendum)
Follow up in 6 months to recheck cholesterol We'll notify you of your lab results and make any changes if needed Try and quit smoking!  You can do it! We'll call you with your GI referral for colonoscopy You can get your COVID booster at your convenience Call with any questions or concerns Stay Safe!  Stay Healthy!

## 2020-02-12 IMAGING — MG DIGITAL SCREENING BILAT W/ TOMO
8 series · 9 of 24 positions shown · non-contrast
Comparison: Previous exam(s).

CLINICAL DATA: Screening.

EXAM:
DIGITAL SCREENING BILATERAL MAMMOGRAM WITH TOMO AND CAD

[R MLO synth-2D]
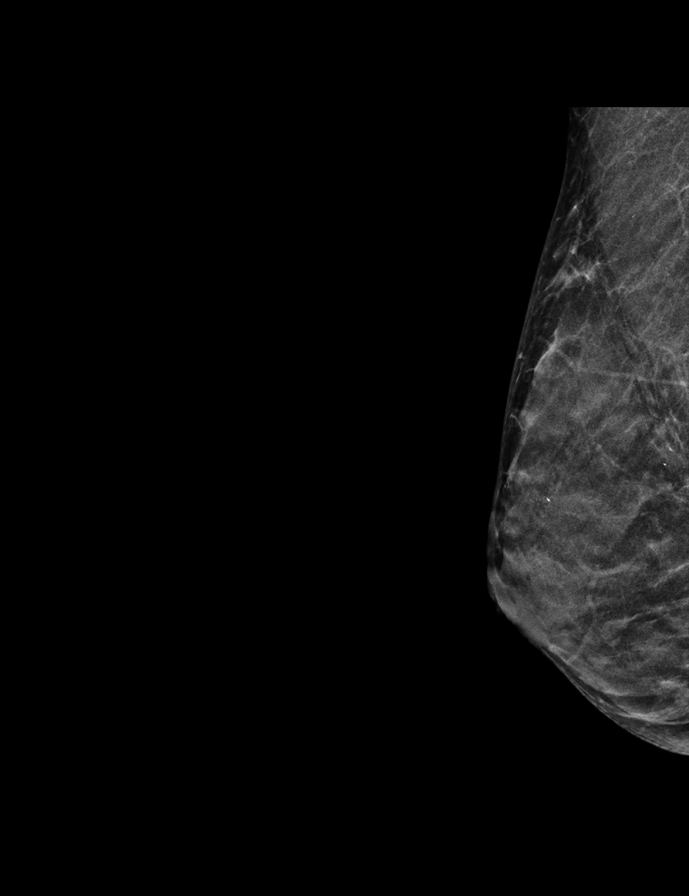

[L CC synth-2D]
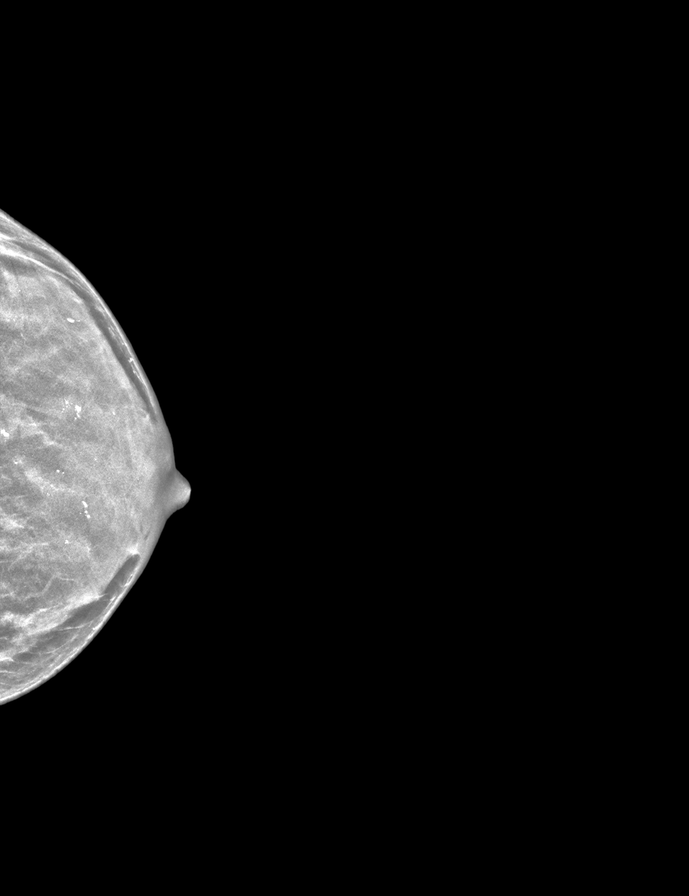

[L MLO synth-2D]
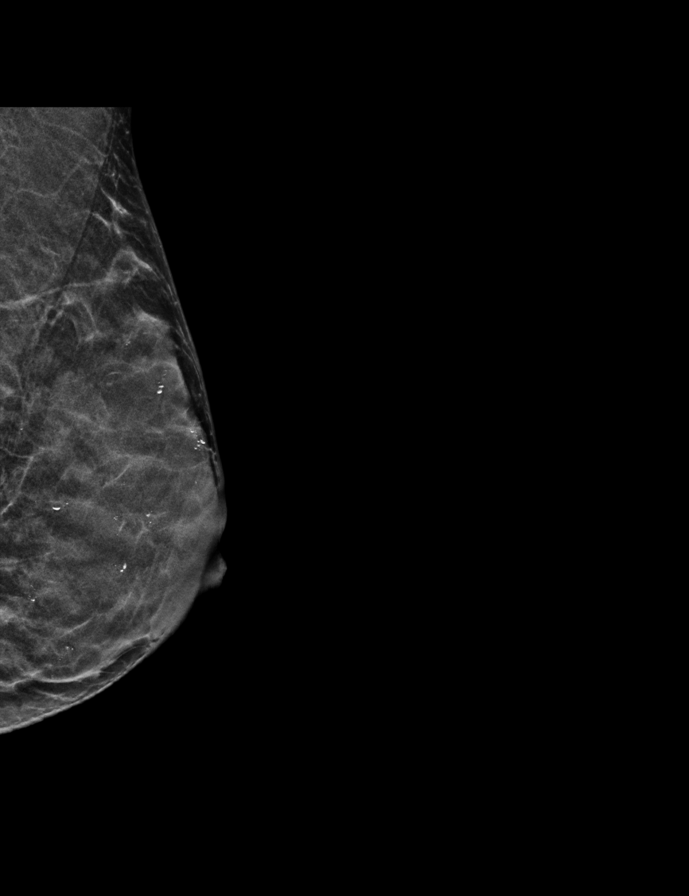

[R CC synth-2D]
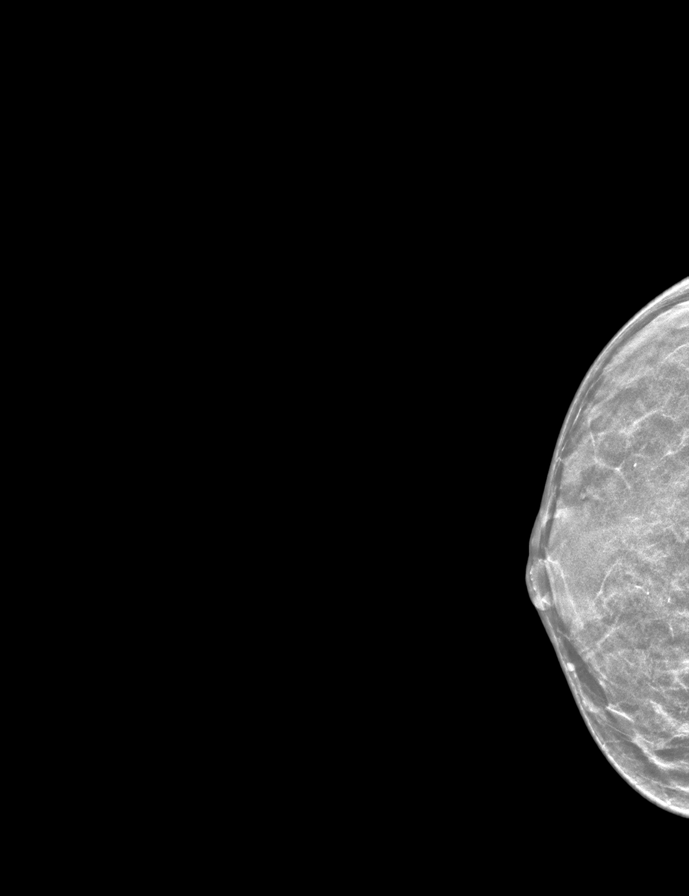

[L CC tomo · 2 of 30 frames shown]
[frame 10/30]
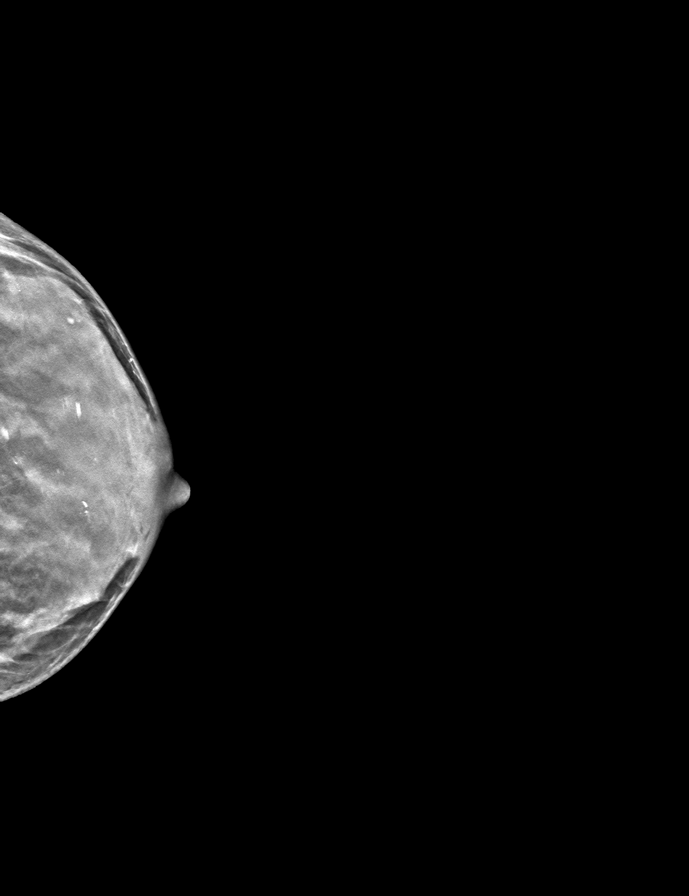
[frame 15/30]
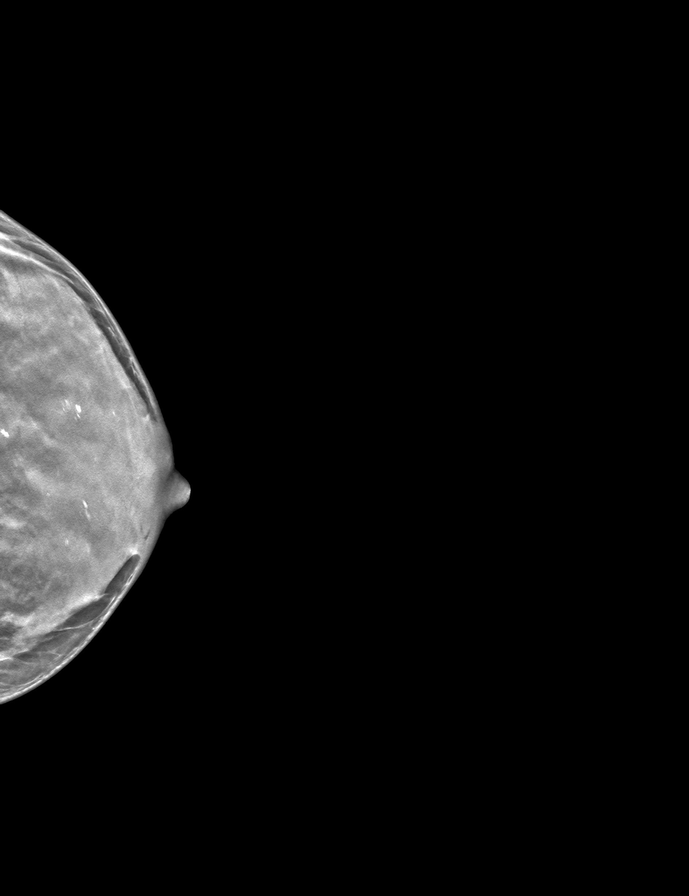

[L MLO tomo · tomo slice 15/30.0]
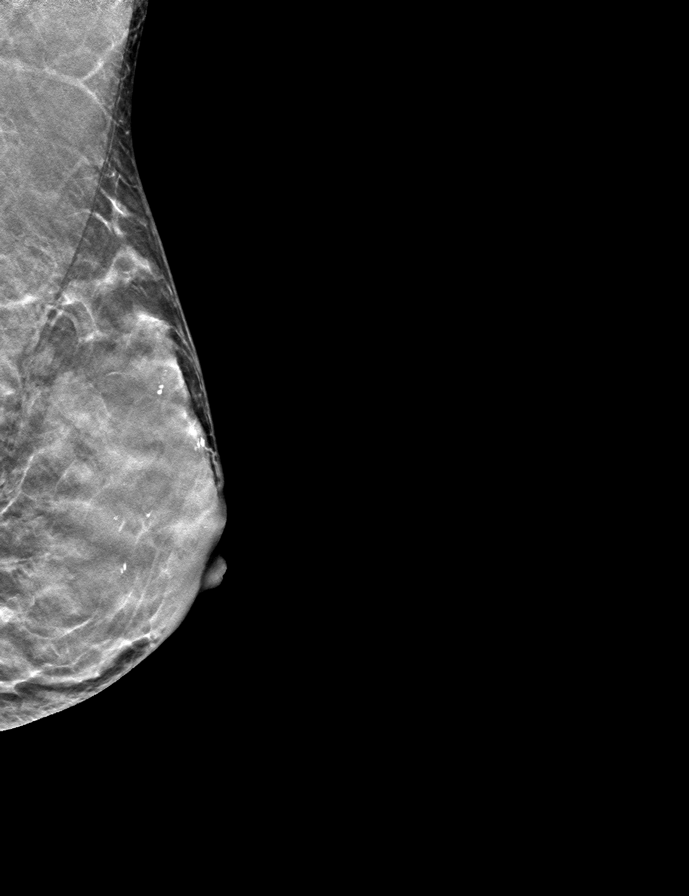

[R MLO tomo · tomo slice 17/33.0]
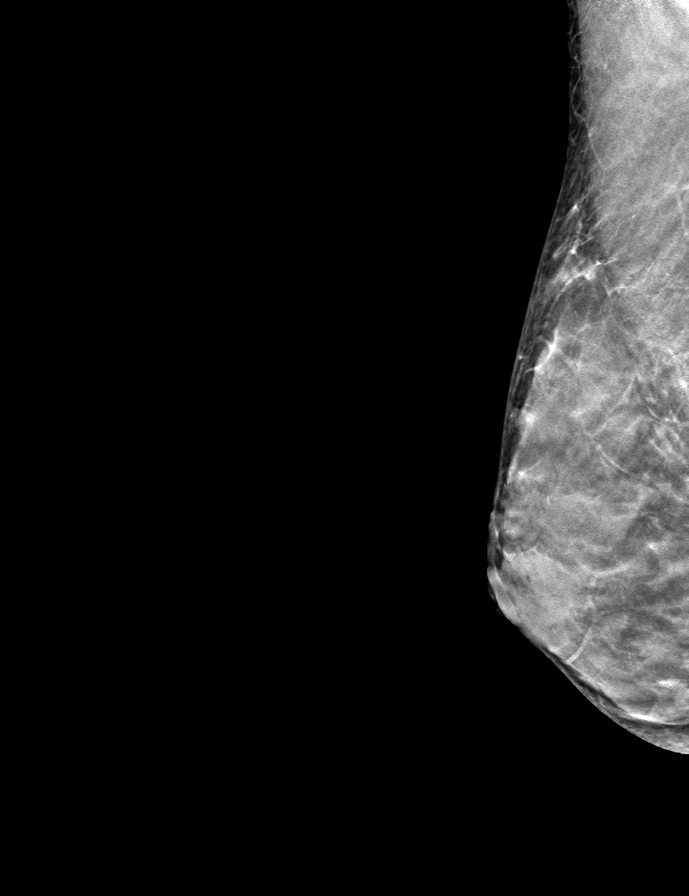

[R CC tomo · tomo slice 15/30.0]
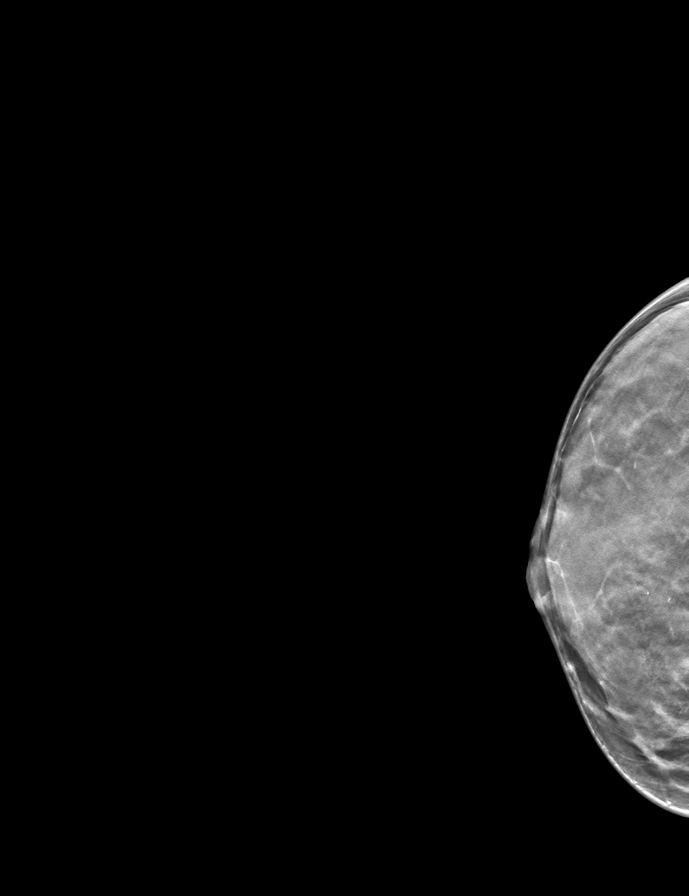

[9 of 24 positions shown; findings below may reference images not displayed]

ACR Breast Density Category d: The breast tissue is extremely dense,
which lowers the sensitivity of mammography
FINDINGS: There are no findings suspicious for malignancy. Images were
processed with CAD.
IMPRESSION: No mammographic evidence of malignancy. A result letter of this
screening mammogram will be mailed directly to the patient.

RECOMMENDATION:
Screening mammogram in one year. (Code:WO-0-ZI0)

BI-RADS CATEGORY  1: Negative.

## 2020-03-19 DIAGNOSIS — F900 Attention-deficit hyperactivity disorder, predominantly inattentive type: Secondary | ICD-10-CM | POA: Diagnosis not present

## 2020-04-03 DIAGNOSIS — Z1231 Encounter for screening mammogram for malignant neoplasm of breast: Secondary | ICD-10-CM | POA: Diagnosis not present

## 2020-07-22 ENCOUNTER — Other Ambulatory Visit: Payer: Self-pay

## 2020-07-22 ENCOUNTER — Ambulatory Visit (AMBULATORY_SURGERY_CENTER): Payer: Self-pay

## 2020-07-22 VITALS — Ht 64.0 in | Wt 100.6 lb

## 2020-07-22 DIAGNOSIS — Z1211 Encounter for screening for malignant neoplasm of colon: Secondary | ICD-10-CM

## 2020-07-22 MED ORDER — PEG-KCL-NACL-NASULF-NA ASC-C 100 G PO SOLR: 1.0000 | Freq: Once | ORAL | 0 refills | Status: AC

## 2020-07-22 NOTE — Progress Notes (Signed)
No allergies to soy or egg Pt is not on blood thinners or diet pills Denies issues with sedation/intubation Denies atrial flutter/fib Denies constipation   Pt is aware of Covid safety and care partner requirements.      

## 2020-07-23 ENCOUNTER — Telehealth: Payer: Self-pay | Admitting: Internal Medicine

## 2020-07-23 NOTE — Telephone Encounter (Signed)
LMOM to call office back.

## 2020-07-23 NOTE — Telephone Encounter (Signed)
Pt states insurance will not cover Moviprep.  Sutabs offered but insurance will not cover.    Plenvu sample and new instructions left on the 4th floor for pt to pick up before Friday

## 2020-07-23 NOTE — Telephone Encounter (Signed)
Error- Sutabs offered but pt states she does not swallow pills well

## 2020-07-23 NOTE — Telephone Encounter (Signed)
Patient returned call requesting call back

## 2020-07-28 ENCOUNTER — Ambulatory Visit (INDEPENDENT_AMBULATORY_CARE_PROVIDER_SITE_OTHER): Payer: BC Managed Care – PPO | Admitting: Family Medicine

## 2020-07-28 ENCOUNTER — Encounter: Payer: Self-pay | Admitting: Family Medicine

## 2020-07-28 ENCOUNTER — Other Ambulatory Visit: Payer: Self-pay

## 2020-07-28 VITALS — BP 99/60 | HR 84 | Temp 97.5°F | Resp 18 | Ht 64.0 in | Wt 100.0 lb

## 2020-07-28 DIAGNOSIS — Z8249 Family history of ischemic heart disease and other diseases of the circulatory system: Secondary | ICD-10-CM | POA: Diagnosis not present

## 2020-07-28 DIAGNOSIS — E559 Vitamin D deficiency, unspecified: Secondary | ICD-10-CM

## 2020-07-28 DIAGNOSIS — E785 Hyperlipidemia, unspecified: Secondary | ICD-10-CM

## 2020-07-28 DIAGNOSIS — Z720 Tobacco use: Secondary | ICD-10-CM | POA: Diagnosis not present

## 2020-07-28 LAB — CBC WITH DIFFERENTIAL/PLATELET
Basophils Absolute: 0.1 10*3/uL (ref 0.0–0.1)
Basophils Relative: 1 % (ref 0.0–3.0)
Eosinophils Absolute: 0.5 10*3/uL (ref 0.0–0.7)
Eosinophils Relative: 7.8 % — ABNORMAL HIGH (ref 0.0–5.0)
HCT: 37.2 % (ref 36.0–46.0)
Hemoglobin: 12.1 g/dL (ref 12.0–15.0)
Lymphocytes Relative: 32.4 % (ref 12.0–46.0)
Lymphs Abs: 1.9 10*3/uL (ref 0.7–4.0)
MCHC: 32.4 g/dL (ref 30.0–36.0)
MCV: 83.6 fl (ref 78.0–100.0)
Monocytes Absolute: 0.5 10*3/uL (ref 0.1–1.0)
Monocytes Relative: 9.4 % (ref 3.0–12.0)
Neutro Abs: 2.9 10*3/uL (ref 1.4–7.7)
Neutrophils Relative %: 49.4 % (ref 43.0–77.0)
Platelets: 211 10*3/uL (ref 150.0–400.0)
RBC: 4.45 Mil/uL (ref 3.87–5.11)
RDW: 13.7 % (ref 11.5–15.5)
WBC: 5.8 10*3/uL (ref 4.0–10.5)

## 2020-07-28 LAB — LIPID PANEL
Cholesterol: 170 mg/dL (ref 0–200)
HDL: 66.2 mg/dL (ref >39.00)
LDL Cholesterol: 92 mg/dL (ref 0–99)
NonHDL: 104.06
Total CHOL/HDL Ratio: 3
Triglycerides: 59 mg/dL (ref 0.0–149.0)
VLDL: 11.8 mg/dL (ref 0.0–40.0)

## 2020-07-28 LAB — HEPATIC FUNCTION PANEL
ALT: 16 U/L (ref 0–35)
AST: 21 U/L (ref 0–37)
Albumin: 4.1 g/dL (ref 3.5–5.2)
Alkaline Phosphatase: 48 U/L (ref 39–117)
Bilirubin, Direct: 0.1 mg/dL (ref 0.0–0.3)
Total Bilirubin: 0.4 mg/dL (ref 0.2–1.2)
Total Protein: 7.1 g/dL (ref 6.0–8.3)

## 2020-07-28 LAB — BASIC METABOLIC PANEL
BUN: 16 mg/dL (ref 6–23)
CO2: 29 mEq/L (ref 19–32)
Calcium: 9.7 mg/dL (ref 8.4–10.5)
Chloride: 103 mEq/L (ref 96–112)
Creatinine, Ser: 0.86 mg/dL (ref 0.40–1.20)
GFR: 79.18 mL/min (ref >60.00)
Glucose, Bld: 65 mg/dL — ABNORMAL LOW (ref 70–99)
Potassium: 4.2 mEq/L (ref 3.5–5.1)
Sodium: 139 mEq/L (ref 135–145)

## 2020-07-28 LAB — TSH: TSH: 1.3 u[IU]/mL (ref 0.35–4.50)

## 2020-07-28 NOTE — Patient Instructions (Signed)
Schedule your complete physical in 6 months We'll notify you of your lab results and make any changes if needed We'll call you with your Vascular referral to discuss AAA Try and quit smoking- you can do it!!! Call with any questions or concerns Stay Safe!  Stay Healthy! Happy Spring!!

## 2020-07-28 NOTE — Progress Notes (Signed)
   Subjective:    Patient ID: Natalie Jones, female    DOB: 11/01/1970, 50 y.o.   MRN: [id number]  HPI Hyperlipidemia- chronic problem, last LDL 148.  Currently taking Cholest-off  No CP, SOB, abd pain, N/V  Family hx of AAA- father had AAA s/p repair, mom just recently dx'd w/ AAA.  Pt is current smoker- pt has cut back.  Was heavy smoker in the past.  Tobacco use- current, smoking 1/4 ppd.  Reports patches have been helpful in the past.     Review of Systems For ROS see HPI   This visit occurred during the SARS-CoV-2 public health emergency.  Safety protocols were in place, including screening questions prior to the visit, additional usage of staff PPE, and extensive cleaning of exam room while observing appropriate contact time as indicated for disinfecting solutions.       Objective:   Physical Exam Vitals reviewed.  Constitutional:      General: She is not in acute distress.    Appearance: Normal appearance. She is well-developed. She is not ill-appearing.  HENT:     Head: Normocephalic and atraumatic.  Eyes:     Conjunctiva/sclera: Conjunctivae normal.     Pupils: Pupils are equal, round, and reactive to light.  Neck:     Thyroid: No thyromegaly.  Cardiovascular:     Rate and Rhythm: Normal rate and regular rhythm.     Pulses: Normal pulses.     Heart sounds: Normal heart sounds. No murmur heard.   Pulmonary:     Effort: Pulmonary effort is normal. No respiratory distress.     Breath sounds: Normal breath sounds.  Abdominal:     General: There is no distension.     Palpations: Abdomen is soft.     Tenderness: There is no abdominal tenderness.  Musculoskeletal:     Cervical back: Normal range of motion and neck supple.     Right lower leg: No edema.     Left lower leg: No edema.  Lymphadenopathy:     Cervical: No cervical adenopathy.  Skin:    General: Skin is warm and dry.  Neurological:     Mental Status: She is alert and oriented to person, place, and time.   Psychiatric:        Behavior: Behavior normal.           Assessment & Plan:  Family hx AAA- father w/ known hx (s/p repair) and mom w/ new dx.  According to current guidelines, she doesn't qualify for screening until at least 65 (if then- data is inconclusive).  But given her hx of hyperlipidemia, tobacco abuse, and strong family hx (mom and dad) will refer to vascular for complete evaluation and any imaging that is appropriate.  Pt expressed understanding and is in agreement w/ plan.

## 2020-07-28 NOTE — Assessment & Plan Note (Signed)
Chronic problem.  Pt is taking OTC Cholest-off.  + family hx.  Last LDL 148.  Check labs.  Adjust meds prn

## 2020-07-28 NOTE — Assessment & Plan Note (Signed)
Strongly encouraged pt to quit smoking- especially given family hx of AAA.  She reports patches have worked well in the past and she will think about restarting.  Will follow.

## 2020-07-29 ENCOUNTER — Encounter: Payer: Self-pay | Admitting: Internal Medicine

## 2020-08-05 ENCOUNTER — Other Ambulatory Visit: Payer: Self-pay

## 2020-08-05 ENCOUNTER — Encounter: Payer: Self-pay | Admitting: Internal Medicine

## 2020-08-05 ENCOUNTER — Ambulatory Visit (AMBULATORY_SURGERY_CENTER): Payer: BC Managed Care – PPO | Admitting: Internal Medicine

## 2020-08-05 VITALS — BP 108/63 | HR 75 | Temp 97.1°F | Resp 14 | Ht 64.0 in | Wt 100.6 lb

## 2020-08-05 DIAGNOSIS — K635 Polyp of colon: Secondary | ICD-10-CM | POA: Diagnosis not present

## 2020-08-05 DIAGNOSIS — Z1211 Encounter for screening for malignant neoplasm of colon: Secondary | ICD-10-CM

## 2020-08-05 DIAGNOSIS — D125 Benign neoplasm of sigmoid colon: Secondary | ICD-10-CM

## 2020-08-05 DIAGNOSIS — D122 Benign neoplasm of ascending colon: Secondary | ICD-10-CM | POA: Diagnosis not present

## 2020-08-05 MED ORDER — SODIUM CHLORIDE 0.9 % IV SOLN: 500.0000 mL | Freq: Once | INTRAVENOUS | Status: DC

## 2020-08-05 NOTE — Patient Instructions (Signed)
Please read handouts provided. Continue present medications. Await pathology results.   YOU HAD AN ENDOSCOPIC PROCEDURE TODAY AT THE [facility]:   Refer to the procedure report that was given to you for any specific questions about what was found during the examination.  If the procedure report does not answer your questions, please call your gastroenterologist to clarify.  If you requested that your care partner not be given the details of your procedure findings, then the procedure report has been included in a sealed envelope for you to review at your convenience later.  YOU SHOULD EXPECT: Some feelings of bloating in the abdomen. Passage of more gas than usual.  Walking can help get rid of the air that was put into your GI tract during the procedure and reduce the bloating. If you had a lower endoscopy (such as a colonoscopy or flexible sigmoidoscopy) you may notice spotting of blood in your stool or on the toilet paper. If you underwent a bowel prep for your procedure, you may not have a normal bowel movement for a few days.  Please Note:  You might notice some irritation and congestion in your nose or some drainage.  This is from the oxygen used during your procedure.  There is no need for concern and it should clear up in a day or so.  SYMPTOMS TO REPORT IMMEDIATELY:  Following lower endoscopy (colonoscopy or flexible sigmoidoscopy):  Excessive amounts of blood in the stool  Significant tenderness or worsening of abdominal pains  Swelling of the abdomen that is new, acute  Fever of 100F or higher   For urgent or emergent issues, a gastroenterologist can be reached at any hour by calling [phone]. Do not use MyChart messaging for urgent concerns.    DIET:  We do recommend a small meal at first, but then you may proceed to your regular diet.  Drink plenty of fluids but you should avoid alcoholic beverages for 24 hours.  ACTIVITY:  You should plan to take it easy  for the rest of today and you should NOT DRIVE or use heavy machinery until tomorrow (because of the sedation medicines used during the test).    FOLLOW UP: Our staff will call the number listed on your records 48-72 hours following your procedure to check on you and address any questions or concerns that you may have regarding the information given to you following your procedure. If we do not reach you, we will leave a message.  We will attempt to reach you two times.  During this call, we will ask if you have developed any symptoms of COVID 19. If you develop any symptoms (ie: fever, flu-like symptoms, shortness of breath, cough etc.) before then, please call [phone].  If you test positive for Covid 19 in the 2 weeks post procedure, please call and report this information to us.    If any biopsies were taken you will be contacted by phone or by letter within the next 1-3 weeks.  Please call us at [phone] if you have not heard about the biopsies in 3 weeks.    SIGNATURES/CONFIDENTIALITY: You and/or your care partner have signed paperwork which will be entered into your electronic medical record.  These signatures attest to the fact that that the information above on your After Visit Summary has been reviewed and is understood.  Full responsibility of the confidentiality of this discharge information lies with you and/or your care-partner.  

## 2020-08-05 NOTE — Progress Notes (Signed)
A and O x3. Report to RN. Tolerated MAC anesthesia well.

## 2020-08-05 NOTE — Progress Notes (Signed)
Pt's states no medical or surgical changes since previsit or office visit.   VS taken by CW 

## 2020-08-05 NOTE — Progress Notes (Signed)
Called to room to assist during endoscopic procedure.  Patient ID and intended procedure confirmed with present staff. Received instructions for my participation in the procedure from the performing physician.  

## 2020-08-05 NOTE — Op Note (Signed)
[facility] Patient Name: Natalie Jones Procedure Date: 08/05/2020 8:36 AM MRN: [id number] Endoscopist: Docia Chuck. Henrene Pastor , MD Age: 50 Referring MD:  Date of Birth: Sep 16, 1970 Gender: Female Account #: [id number] Procedure:                Colonoscopy with cold snare polypectomy x 3 Indications:              Screening for colorectal malignant neoplasm Medicines:                Monitored Anesthesia Care Procedure:                Pre-Anesthesia Assessment:                           - Prior to the procedure, a History and Physical                            was performed, and patient medications and                            allergies were reviewed. The patient's tolerance of                            previous anesthesia was also reviewed. The risks                            and benefits of the procedure and the sedation                            options and risks were discussed with the patient.                            All questions were answered, and informed consent                            was obtained. Prior Anticoagulants: The patient has                            taken no previous anticoagulant or antiplatelet                            agents. ASA Grade Assessment: I - A normal, healthy                            patient. After reviewing the risks and benefits,                            the patient was deemed in satisfactory condition to                            undergo the procedure.                           After obtaining informed consent, the colonoscope  was passed under direct vision. Throughout the                            procedure, the patient's blood pressure, pulse, and                            oxygen saturations were monitored continuously. The                            Olympus CF-HQ190 [id number]) Colonoscope was                            introduced through the anus and advanced to the the                             cecum, identified by appendiceal orifice and                            ileocecal valve. The ileocecal valve, appendiceal                            orifice, and rectum were photographed. The quality                            of the bowel preparation was excellent. The                            colonoscopy was performed without difficulty. The                            patient tolerated the procedure well. The bowel                            preparation used was MoviPrep via split dose                            instruction. Scope In: 8:54:11 AM Scope Out: 9:14:28 AM Scope Withdrawal Time: 0 hours 16 minutes 32 seconds  Total Procedure Duration: 0 hours 20 minutes 17 seconds  Findings:                 Three polyps were found in the sigmoid colon and                            ascending colon. The polyps were 2 to 5 mm in size.                            These polyps were removed with a cold snare.                            Resection and retrieval were complete.                           The exam was otherwise without abnormality on  direct and retroflexion views. Complications:            No immediate complications. Estimated blood loss:                            None. Estimated Blood Loss:     Estimated blood loss: none. Impression:               - Three 2 to 5 mm polyps in the sigmoid colon and                            in the ascending colon, removed with a cold snare.                            Resected and retrieved.                           - The examination was otherwise normal on direct                            and retroflexion views. Recommendation:           - Repeat colonoscopy in 5 years for surveillance.                           - Patient has a contact number available for                            emergencies. The signs and symptoms of potential                            delayed complications were discussed with the                             patient. Return to normal activities tomorrow.                            Written discharge instructions were provided to the                            patient.                           - Resume previous diet.                           - Continue present medications.                           - Await pathology results. Docia Chuck. Henrene Pastor, MD 08/05/2020 9:20:54 AM This report has been signed electronically.

## 2020-08-07 ENCOUNTER — Telehealth: Payer: Self-pay | Admitting: *Deleted

## 2020-08-07 NOTE — Telephone Encounter (Signed)
First follow up call attempt.  LVM. 

## 2020-08-07 NOTE — Telephone Encounter (Signed)
  Follow up Call-  Call back number 08/05/2020  Post procedure Call Back phone  # [phone]  Permission to leave phone message Yes  Some recent data might be hidden     Patient questions:  Do you have a fever, pain , or abdominal swelling? No. Pain Score  0 *  Have you tolerated food without any problems? Yes.    Have you been able to return to your normal activities? Yes.    Do you have any questions about your discharge instructions: Diet   No. Medications  No. Follow up visit  No.  Do you have questions or concerns about your Care? Yes.    Actions: * If pain score is 4 or above: No action needed, pain <4.  1. Have you developed a fever since your procedure? no  2.   Have you had an respiratory symptoms (SOB or cough) since your procedure? no  3.   Have you tested positive for COVID 19 since your procedure no  4.   Have you had any family members/close contacts diagnosed with the COVID 19 since your procedure? no   If yes to any of these questions please route to Joylene John, RN and Joella Prince, RN

## 2020-08-11 ENCOUNTER — Encounter: Payer: Self-pay | Admitting: Internal Medicine

## 2020-08-22 ENCOUNTER — Other Ambulatory Visit: Payer: Self-pay

## 2020-08-22 DIAGNOSIS — I714 Abdominal aortic aneurysm, without rupture, unspecified: Secondary | ICD-10-CM

## 2020-09-10 DIAGNOSIS — F900 Attention-deficit hyperactivity disorder, predominantly inattentive type: Secondary | ICD-10-CM | POA: Diagnosis not present

## 2020-09-12 ENCOUNTER — Encounter: Payer: BC Managed Care – PPO | Admitting: Vascular Surgery

## 2020-09-12 ENCOUNTER — Other Ambulatory Visit (HOSPITAL_COMMUNITY): Payer: BC Managed Care – PPO

## 2020-09-16 ENCOUNTER — Other Ambulatory Visit: Payer: Self-pay

## 2020-09-16 ENCOUNTER — Encounter: Payer: Self-pay | Admitting: Vascular Surgery

## 2020-09-16 ENCOUNTER — Ambulatory Visit (HOSPITAL_COMMUNITY)
Admission: RE | Admit: 2020-09-16 | Discharge: 2020-09-16 | Disposition: A | Discharge status: Home / Self Care | Payer: BC Managed Care – PPO | Source: Ambulatory Visit | Attending: Vascular Surgery | Admitting: Vascular Surgery

## 2020-09-16 ENCOUNTER — Ambulatory Visit: Payer: BC Managed Care – PPO | Admitting: Vascular Surgery

## 2020-09-16 DIAGNOSIS — Z136 Encounter for screening for cardiovascular disorders: Secondary | ICD-10-CM | POA: Diagnosis not present

## 2020-09-16 DIAGNOSIS — Z8249 Family history of ischemic heart disease and other diseases of the circulatory system: Secondary | ICD-10-CM | POA: Diagnosis not present

## 2020-09-16 DIAGNOSIS — E785 Hyperlipidemia, unspecified: Secondary | ICD-10-CM | POA: Diagnosis not present

## 2020-09-16 DIAGNOSIS — I714 Abdominal aortic aneurysm, without rupture, unspecified: Secondary | ICD-10-CM

## 2020-09-16 NOTE — Progress Notes (Signed)
Patient name: Natalie Jones MRN: [id number] DOB: 1970-11-11 Sex: female  REASON FOR CONSULT: Evaluate for abdominal aortic aneurysm  HPI: Natalie Jones is a 50 y.o. female, with history of tobacco abuse and hyperlipidemia that presents for evaluation of abdominal aortic aneurysm.  She is referred by her PCP Dr. Birdie Riddle given significant family history.  She states her father and her mother both had abdominal aortic aneurysms.  She believes her father had a 6 centimeter aneurysm that was repaired open.  She has no prior knowledge of an aneurysm from prior imaging but was very concerned given her family history.  States she smokes about 1 to 2 cigarettes a day.  She does work in the [employer] for [employer].  Past Medical History:  Diagnosis Date  . ADHD   . Hyperlipidemia     Past Surgical History:  Procedure Laterality Date  . collapsed lung    . HAND SURGERY      Family History  Problem Relation Age of Onset  . Hypertension Mother   . Hyperlipidemia Mother   . Aortic aneurysm Mother   . Colon polyps Mother   . Hypertension Father   . Hyperlipidemia Father   . Cancer Father        Prostate and adrenal gland  . AAA (abdominal aortic aneurysm) Father   . Colon polyps Father   . Esophageal cancer Maternal Uncle   . Colon cancer Neg Hx   . Stomach cancer Neg Hx   . Rectal cancer Neg Hx     SOCIAL HISTORY: Social History   Socioeconomic History  . Marital status: Married    Spouse name: Not on file  . Number of children: Not on file  . Years of education: Not on file  . Highest education level: Not on file  Occupational History  . Not on file  Tobacco Use  . Smoking status: Current Every Day Smoker    Packs/day: 0.25    Years: 27.00    Pack years: 6.75    Types: Cigarettes  . Smokeless tobacco: Never Used  Vaping Use  . Vaping Use: Never used  Substance and Sexual Activity  . Alcohol use: Not Currently    Comment: occ  . Drug use: Not Currently     Types: Mescaline  . Sexual activity: Not Currently    Birth control/protection: None  Other Topics Concern  . Not on file  Social History Narrative  . Not on file   Social Determinants of Health   Financial Resource Strain: Not on file  Food Insecurity: Not on file  Transportation Needs: Not on file  Physical Activity: Not on file  Stress: Not on file  Social Connections: Not on file  Intimate Partner Violence: Not on file    No Known Allergies  Current Outpatient Medications  Medication Sig Dispense Refill  . amphetamine-dextroamphetamine (ADDERALL) 20 MG tablet Take 20 mg by mouth 3 (three) times daily. Takes at 0700, 1200, and 1600.    . ibuprofen (ADVIL,MOTRIN) 200 MG tablet Take 400 mg by mouth as needed for cramping.    . Multiple Vitamins-Minerals (MULTIVITAL PO) Take by mouth.    Marland Kitchen VITAMIN D PO Take by mouth.     No current facility-administered medications for this visit.    REVIEW OF SYSTEMS:  [X]  denotes positive finding, [ ]  denotes negative finding Cardiac  Comments:  Chest pain or chest pressure:    Shortness of breath upon exertion:    Short  of breath when lying flat:    Irregular heart rhythm:        Vascular    Pain in calf, thigh, or hip brought on by ambulation:    Pain in feet at night that wakes you up from your sleep:     Blood clot in your veins:    Leg swelling:         Pulmonary    Oxygen at home:    Productive cough:     Wheezing:         Neurologic    Sudden weakness in arms or legs:     Sudden numbness in arms or legs:     Sudden onset of difficulty speaking or slurred speech:    Temporary loss of vision in one eye:     Problems with dizziness:         Gastrointestinal    Blood in stool:     Vomited blood:         Genitourinary    Burning when urinating:     Blood in urine:        Psychiatric    Major depression:         Hematologic    Bleeding problems:    Problems with blood clotting too easily:        Skin     Rashes or ulcers:        Constitutional    Fever or chills:      PHYSICAL EXAM: Vitals:   09/16/20 0914  BP: 115/68  Pulse: 84  Temp: 98 F (36.7 C)  TempSrc: Skin  SpO2: 98%  Weight: 103 lb 4.8 oz (46.9 kg)  Height: 5\' 3"  (1.6 m)    GENERAL: The patient is a well-nourished female, in no acute distress. The vital signs are documented above. CARDIAC: There is a regular rate and rhythm.  VASCULAR:  Palpable femoral pulses bilaterally Palpable PT pulses bilaterally PULMONARY: No respiratory distress. ABDOMEN: Soft and non-tender. MUSCULOSKELETAL: There are no major deformities or cyanosis. NEUROLOGIC: No focal weakness or paresthesias are detected. SKIN: There are no ulcers or rashes noted. PSYCHIATRIC: The patient has a normal affect.  DATA:   AAA duplex today shows no evidence of abdominal aortic aneurysm  Assessment/Plan:  50 year old female with strong family history of AAA that presents to rule out any evidence of abdominal aortic aneurysm.  Discussed with her that her AAA duplex today shows no evidence of AAA.  I do not think she needs any further follow-up with me at this time.  Discussed the importance of smoking cessation.   Marty Heck, MD [facility] Office: [phone]

## 2020-10-15 ENCOUNTER — Encounter: Payer: Self-pay | Admitting: *Deleted

## 2021-02-10 DIAGNOSIS — Z01419 Encounter for gynecological examination (general) (routine) without abnormal findings: Secondary | ICD-10-CM | POA: Diagnosis not present

## 2021-02-10 DIAGNOSIS — D509 Iron deficiency anemia, unspecified: Secondary | ICD-10-CM | POA: Diagnosis not present

## 2021-02-10 DIAGNOSIS — N951 Menopausal and female climacteric states: Secondary | ICD-10-CM | POA: Diagnosis not present

## 2021-02-10 DIAGNOSIS — Z130 Encounter for screening for diseases of the blood and blood-forming organs and certain disorders involving the immune mechanism: Secondary | ICD-10-CM | POA: Diagnosis not present

## 2021-02-13 DIAGNOSIS — F900 Attention-deficit hyperactivity disorder, predominantly inattentive type: Secondary | ICD-10-CM | POA: Diagnosis not present

## 2021-03-24 ENCOUNTER — Telehealth (INDEPENDENT_AMBULATORY_CARE_PROVIDER_SITE_OTHER): Payer: BC Managed Care – PPO | Admitting: Medical

## 2021-03-24 VITALS — BP 110/50 | HR 92 | Temp 98.8°F | Wt 105.0 lb

## 2021-03-24 DIAGNOSIS — J40 Bronchitis, not specified as acute or chronic: Secondary | ICD-10-CM | POA: Diagnosis not present

## 2021-03-24 DIAGNOSIS — R058 Other specified cough: Secondary | ICD-10-CM

## 2021-03-24 MED ORDER — ALBUTEROL SULFATE HFA 108 (90 BASE) MCG/ACT IN AERS: 2.0000 | INHALATION_SPRAY | Freq: Four times a day (QID) | RESPIRATORY_TRACT | 0 refills | Status: DC | PRN

## 2021-03-24 MED ORDER — AZITHROMYCIN 250 MG PO TABS: ORAL_TABLET | ORAL | 0 refills | Status: AC

## 2021-03-24 MED ORDER — BENZONATATE 100 MG PO CAPS: 100.0000 mg | ORAL_CAPSULE | Freq: Three times a day (TID) | ORAL | 0 refills | Status: DC | PRN

## 2021-03-24 MED ORDER — FLUTICASONE PROPIONATE 50 MCG/ACT NA SUSP: 2.0000 | Freq: Every day | NASAL | 1 refills | Status: DC

## 2021-03-24 NOTE — Progress Notes (Signed)
   Subjective:    Patient ID: Natalie Jones, female    DOB: Oct 19, 1970, 50 y.o.   MRN: [id number]  HPI  Virtual Visit via Telephone Note  I connected with Noemi Parlier on 03/24/21 at  3:40 PM EST by telephone and verified that I am speaking with the correct person using two identifiers.  Location: Patient: home Provider: home   I discussed the limitations, risks, security and privacy concerns of performing an evaluation and management service by telephone and the availability of in person appointments. I also discussed with the patient that there may be a patient responsible charge related to this service. The patient expressed understanding and agreed to proceed.   History of Present Illness: Pt states 2 days ago mild nasal congested and now has some cough. Pt states has productive cough. More chest congestion. No fever, no chills or sweats.  She get bronchitis occasionally.  No wheezing. No sob. Pt convinced has bronchitis and indicated may need antibiotic.   2 covid test last 2 days negative.   Pt does smoke. She states in past would use prednisone. On discussion has used prednisone when wheezing significantly.     Observations/Objective:  General- no acute, pleasant and alert.  Assessment and Plan:  Patient Instructions  You appear to have bronchitis. Rest hydrate and tylenol for fever. I am prescribing cough medicine benzonatate, and azithromycin antibiotic. For your nasal congestion rx flonase.  For wheezing use albuterol inhaler.  If wheezing persists/worsens then will add on tapered prednisone.  Discussed if cough worsens despite benzonatate then consider cough syrup options.  You should gradually get better. If not then notify us and would recommend a chest xray.  Follow up in 7-10 days or as needed    Time spent with patient today was 22  minutes which consisted of chart revdiew, discussing diagnosis, work up treatment and documentation.   Follow Up  Instructions:    I discussed the assessment and treatment plan with the patient. The patient was provided an opportunity to ask questions and all were answered. The patient agreed with the plan and demonstrated an understanding of the instructions.   The patient was advised to call back or seek an in-person evaluation if the symptoms worsen or if the condition fails to improve as anticipated.     Mackie Pai, PA-C   Review of Systems  Constitutional:  Negative for chills, fatigue and fever.  HENT:  Positive for congestion. Negative for ear pain, sinus pressure and sinus pain.   Respiratory:  Positive for cough and wheezing. Negative for choking and shortness of breath.   Cardiovascular:  Negative for chest pain and palpitations.  Gastrointestinal:  Negative for abdominal pain.  Musculoskeletal:  Negative for back pain, myalgias and neck pain.  Neurological:  Negative for dizziness and headaches.  Hematological:  Negative for adenopathy.  Psychiatric/Behavioral:  Negative for behavioral problems and confusion. The patient is not nervous/anxious.       Objective:   Physical Exam        Assessment & Plan:

## 2021-03-24 NOTE — Patient Instructions (Addendum)
You appear to have bronchitis. Rest hydrate and tylenol for fever. I am prescribing cough medicine benzonatate, and azithromycin antibiotic. For your nasal congestion rx flonase.  For wheezing use albuterol inhaler.  If wheezing persists/worsens then will add on tapered prednisone.  Discussed if cough worsens despite benzonatate then consider cough syrup options.  You should gradually get better. If not then notify us and would recommend a chest xray.  Follow up in 7-10 days or as needed

## 2021-04-14 DIAGNOSIS — Z1231 Encounter for screening mammogram for malignant neoplasm of breast: Secondary | ICD-10-CM | POA: Diagnosis not present

## 2021-04-14 LAB — HM MAMMOGRAPHY

## 2021-05-21 ENCOUNTER — Encounter: Payer: Self-pay | Admitting: Family Medicine

## 2021-05-21 ENCOUNTER — Ambulatory Visit (INDEPENDENT_AMBULATORY_CARE_PROVIDER_SITE_OTHER): Payer: BC Managed Care – PPO | Admitting: Family Medicine

## 2021-05-21 VITALS — BP 100/68 | HR 106 | Temp 97.5°F | Resp 16 | Ht 63.0 in | Wt 105.0 lb

## 2021-05-21 DIAGNOSIS — E559 Vitamin D deficiency, unspecified: Secondary | ICD-10-CM | POA: Diagnosis not present

## 2021-05-21 DIAGNOSIS — Z23 Encounter for immunization: Secondary | ICD-10-CM | POA: Diagnosis not present

## 2021-05-21 DIAGNOSIS — E785 Hyperlipidemia, unspecified: Secondary | ICD-10-CM | POA: Diagnosis not present

## 2021-05-21 DIAGNOSIS — Z0000 Encounter for general adult medical examination without abnormal findings: Secondary | ICD-10-CM

## 2021-05-21 DIAGNOSIS — Z114 Encounter for screening for human immunodeficiency virus [HIV]: Secondary | ICD-10-CM | POA: Diagnosis not present

## 2021-05-21 DIAGNOSIS — Z1159 Encounter for screening for other viral diseases: Secondary | ICD-10-CM

## 2021-05-21 LAB — CBC WITH DIFFERENTIAL/PLATELET
Basophils Absolute: 0 10*3/uL (ref 0.0–0.1)
Basophils Relative: 0.9 % (ref 0.0–3.0)
Eosinophils Absolute: 0.5 10*3/uL (ref 0.0–0.7)
Eosinophils Relative: 8.9 % — ABNORMAL HIGH (ref 0.0–5.0)
HCT: 36.5 % (ref 36.0–46.0)
Hemoglobin: 11.8 g/dL — ABNORMAL LOW (ref 12.0–15.0)
Lymphocytes Relative: 36.1 % (ref 12.0–46.0)
Lymphs Abs: 1.8 10*3/uL (ref 0.7–4.0)
MCHC: 32.4 g/dL (ref 30.0–36.0)
MCV: 84.1 fl (ref 78.0–100.0)
Monocytes Absolute: 0.5 10*3/uL (ref 0.1–1.0)
Monocytes Relative: 8.9 % (ref 3.0–12.0)
Neutro Abs: 2.3 10*3/uL (ref 1.4–7.7)
Neutrophils Relative %: 45.2 % (ref 43.0–77.0)
Platelets: 213 10*3/uL (ref 150.0–400.0)
RBC: 4.34 Mil/uL (ref 3.87–5.11)
RDW: 13.4 % (ref 11.5–15.5)
WBC: 5.1 10*3/uL (ref 4.0–10.5)

## 2021-05-21 LAB — LIPID PANEL
Cholesterol: 196 mg/dL (ref 0–200)
HDL: 74 mg/dL (ref >39.00)
LDL Cholesterol: 107 mg/dL — ABNORMAL HIGH (ref 0–99)
NonHDL: 121.95
Total CHOL/HDL Ratio: 3
Triglycerides: 74 mg/dL (ref 0.0–149.0)
VLDL: 14.8 mg/dL (ref 0.0–40.0)

## 2021-05-21 LAB — BASIC METABOLIC PANEL
BUN: 10 mg/dL (ref 6–23)
CO2: 32 mEq/L (ref 19–32)
Calcium: 9.6 mg/dL (ref 8.4–10.5)
Chloride: 103 mEq/L (ref 96–112)
Creatinine, Ser: 0.71 mg/dL (ref 0.40–1.20)
GFR: 99.09 mL/min (ref >60.00)
Glucose, Bld: 86 mg/dL (ref 70–99)
Potassium: 4.1 mEq/L (ref 3.5–5.1)
Sodium: 140 mEq/L (ref 135–145)

## 2021-05-21 LAB — TSH: TSH: 2.66 u[IU]/mL (ref 0.35–5.50)

## 2021-05-21 LAB — HEPATIC FUNCTION PANEL
ALT: 19 U/L (ref 0–35)
AST: 20 U/L (ref 0–37)
Albumin: 4.2 g/dL (ref 3.5–5.2)
Alkaline Phosphatase: 54 U/L (ref 39–117)
Bilirubin, Direct: 0.1 mg/dL (ref 0.0–0.3)
Total Bilirubin: 0.5 mg/dL (ref 0.2–1.2)
Total Protein: 6.8 g/dL (ref 6.0–8.3)

## 2021-05-21 LAB — VITAMIN D 25 HYDROXY (VIT D DEFICIENCY, FRACTURES): VITD: 69.24 ng/mL (ref 30.00–100.00)

## 2021-05-21 NOTE — Patient Instructions (Signed)
Follow up in 1 year or as needed We'll notify you of your lab results and make any changes if needed Make sure you are eating regularly and drinking plenty of fluids Call with any questions or concerns Stay Safe!  Stay Healthy! Have a great weekend!

## 2021-05-21 NOTE — Progress Notes (Signed)
° °  Subjective:    Patient ID: Natalie Jones, female    DOB: 1970-10-20, 51 y.o.   MRN: [id number]  HPI CPE- UTD on Tdap, pap, colonoscopy.  Pt reports UTD on mammo- done at GYN.  Patient Care Team    Relationship Specialty Notifications Start End  Midge Minium, MD PCP - General Family Medicine  05/03/16   Key, Nelia Shi, NP Nurse Practitioner Gynecology  08/19/17     Health Maintenance  Topic Date Due   HIV Screening  Never done   Hepatitis C Screening  Never done   Zoster Vaccines- Shingrix (1 of 2) Never done   MAMMOGRAM  02/14/2021   PAP SMEAR-Modifier  11/27/2022   COLONOSCOPY (Pts 45-3yrs Insurance coverage will need to be confirmed)  08/05/2025   TETANUS/TDAP  08/20/2027   INFLUENZA VACCINE  Completed   COVID-19 Vaccine  Completed   HPV VACCINES  Aged Out      Review of Systems Patient reports no vision/ hearing changes, adenopathy,fever, weight change,  persistant/recurrent hoarseness , swallowing issues, chest pain, palpitations, edema, persistant/recurrent cough, hemoptysis, dyspnea (rest/exertional/paroxysmal nocturnal), gastrointestinal bleeding (melena, rectal bleeding), abdominal pain, significant heartburn, bowel changes, GU symptoms (dysuria, hematuria, incontinence), Gyn symptoms (abnormal  bleeding, pain),  syncope, focal weakness, memory loss, numbness & tingling, skin/hair/nail changes, abnormal bruising or bleeding, anxiety, or depression.   This visit occurred during the SARS-CoV-2 public health emergency.  Safety protocols were in place, including screening questions prior to the visit, additional usage of staff PPE, and extensive cleaning of exam room while observing appropriate contact time as indicated for disinfecting solutions.      Objective:   Physical Exam General Appearance:    Alert, cooperative, no distress, appears stated age  Head:    Normocephalic, without obvious abnormality, atraumatic  Eyes:    PERRL, conjunctiva/corneas clear, EOM's intact,  fundi    benign, both eyes  Ears:    Normal TM's and external ear canals, both ears  Nose:   Deferred due to COVID  Throat:   Neck:   Supple, symmetrical, trachea midline, no adenopathy;    Thyroid: no enlargement/tenderness/nodules  Back:     Symmetric, no curvature, ROM normal, no CVA tenderness  Lungs:     Clear to auscultation bilaterally, respirations unlabored  Chest Wall:    No tenderness or deformity   Heart:    Regular rate and rhythm, S1 and S2 normal, no murmur, rub   or gallop  Breast Exam:    Deferred to GYN  Abdomen:     Soft, non-tender, bowel sounds active all four quadrants,    no masses, no organomegaly  Genitalia:    Deferred to GYN  Rectal:    Extremities:   Extremities normal, atraumatic, no cyanosis or edema  Pulses:   2+ and symmetric all extremities  Skin:   Skin color, texture, turgor normal, no rashes or lesions  Lymph nodes:   Cervical, supraclavicular, and axillary nodes normal  Neurologic:   CNII-XII intact, normal strength, sensation and reflexes    throughout          Assessment & Plan:

## 2021-05-21 NOTE — Assessment & Plan Note (Signed)
Pt is taking OTC cholestoff and has had good results.  Repeat labs today and see if changes are needed

## 2021-05-21 NOTE — Assessment & Plan Note (Signed)
Check labs and replete prn. 

## 2021-05-21 NOTE — Assessment & Plan Note (Signed)
Pt's PE WNL.  UTD on pap, mammo, colonoscopy.  Shingrix given today.  Check labs.  Anticipatory guidance provided.

## 2021-05-22 LAB — HIV ANTIBODY (ROUTINE TESTING W REFLEX): HIV 1&2 Ab, 4th Generation: NONREACTIVE

## 2021-05-22 LAB — HEPATITIS C ANTIBODY
Hepatitis C Ab: NONREACTIVE
SIGNAL TO CUT-OFF: <0.02 (ref <1.00)

## 2021-06-02 ENCOUNTER — Encounter: Payer: Self-pay | Admitting: Family Medicine

## 2021-08-06 DIAGNOSIS — F900 Attention-deficit hyperactivity disorder, predominantly inattentive type: Secondary | ICD-10-CM | POA: Diagnosis not present

## 2021-08-09 ENCOUNTER — Other Ambulatory Visit: Payer: Self-pay | Admitting: Medical

## 2021-09-18 ENCOUNTER — Telehealth: Payer: Self-pay

## 2021-09-18 ENCOUNTER — Ambulatory Visit (INDEPENDENT_AMBULATORY_CARE_PROVIDER_SITE_OTHER): Payer: BC Managed Care – PPO | Admitting: Family Medicine

## 2021-09-18 DIAGNOSIS — Z23 Encounter for immunization: Secondary | ICD-10-CM

## 2021-09-18 NOTE — Progress Notes (Signed)
Pt here for 2nd shingrix vaccine, no questions today pt feels well

## 2021-09-18 NOTE — Telephone Encounter (Signed)
Given to Dr. Carlota Raspberry for review. Once reviewed the immunization/vaccine order will be scanned into chart.

## 2021-10-14 ENCOUNTER — Other Ambulatory Visit: Payer: Self-pay | Admitting: Medical

## 2021-10-29 DIAGNOSIS — M542 Cervicalgia: Secondary | ICD-10-CM | POA: Diagnosis not present

## 2021-11-19 DIAGNOSIS — M25512 Pain in left shoulder: Secondary | ICD-10-CM | POA: Diagnosis not present

## 2021-11-26 DIAGNOSIS — M5412 Radiculopathy, cervical region: Secondary | ICD-10-CM | POA: Diagnosis not present

## 2022-01-28 DIAGNOSIS — F900 Attention-deficit hyperactivity disorder, predominantly inattentive type: Secondary | ICD-10-CM | POA: Diagnosis not present

## 2022-03-05 ENCOUNTER — Telehealth: Payer: Self-pay | Admitting: Family Medicine

## 2022-03-05 NOTE — Telephone Encounter (Signed)
Caller name: Floyd Lusignan  On DPR?: Yes  Call back number: [phone] (mobile)  Provider they see: Midge Minium, MD  Reason for call:  Pt called stating that she had some blood work done and triglyceride was high(211). Pt want to know if she can get that rechecked.

## 2022-03-05 NOTE — Telephone Encounter (Signed)
When would you like pt to come back in for repeat labs ?

## 2022-03-08 NOTE — Telephone Encounter (Signed)
Spoke to the pt and advised we will recheck at physical in Feb

## 2022-03-08 NOTE — Telephone Encounter (Signed)
That will be repeated at her upcoming physical in Feb.  No need to recheck sooner

## 2022-05-27 ENCOUNTER — Encounter: Payer: BC Managed Care – PPO | Admitting: Family Medicine

## 2022-05-27 ENCOUNTER — Encounter: Payer: Self-pay | Admitting: Family Medicine

## 2022-05-27 ENCOUNTER — Telehealth: Payer: Self-pay

## 2022-05-27 ENCOUNTER — Ambulatory Visit (INDEPENDENT_AMBULATORY_CARE_PROVIDER_SITE_OTHER): Payer: BC Managed Care – PPO | Admitting: Family Medicine

## 2022-05-27 VITALS — BP 122/68 | HR 95 | Temp 98.0°F | Resp 19 | Ht 63.0 in | Wt 103.5 lb

## 2022-05-27 DIAGNOSIS — Z0000 Encounter for general adult medical examination without abnormal findings: Secondary | ICD-10-CM | POA: Diagnosis not present

## 2022-05-27 DIAGNOSIS — Z8249 Family history of ischemic heart disease and other diseases of the circulatory system: Secondary | ICD-10-CM

## 2022-05-27 DIAGNOSIS — E785 Hyperlipidemia, unspecified: Secondary | ICD-10-CM | POA: Diagnosis not present

## 2022-05-27 DIAGNOSIS — E559 Vitamin D deficiency, unspecified: Secondary | ICD-10-CM | POA: Diagnosis not present

## 2022-05-27 DIAGNOSIS — Z720 Tobacco use: Secondary | ICD-10-CM

## 2022-05-27 DIAGNOSIS — R350 Frequency of micturition: Secondary | ICD-10-CM

## 2022-05-27 LAB — CBC WITH DIFFERENTIAL/PLATELET
Basophils Absolute: 0 10*3/uL (ref 0.0–0.1)
Basophils Relative: 0.6 % (ref 0.0–3.0)
Eosinophils Absolute: 0.4 10*3/uL (ref 0.0–0.7)
Eosinophils Relative: 7.3 % — ABNORMAL HIGH (ref 0.0–5.0)
HCT: 37.5 % (ref 36.0–46.0)
Hemoglobin: 12.3 g/dL (ref 12.0–15.0)
Lymphocytes Relative: 38 % (ref 12.0–46.0)
Lymphs Abs: 2.2 10*3/uL (ref 0.7–4.0)
MCHC: 32.8 g/dL (ref 30.0–36.0)
MCV: 84.2 fl (ref 78.0–100.0)
Monocytes Absolute: 0.6 10*3/uL (ref 0.1–1.0)
Monocytes Relative: 10.2 % (ref 3.0–12.0)
Neutro Abs: 2.6 10*3/uL (ref 1.4–7.7)
Neutrophils Relative %: 43.9 % (ref 43.0–77.0)
Platelets: 219 10*3/uL (ref 150.0–400.0)
RBC: 4.45 Mil/uL (ref 3.87–5.11)
RDW: 13.6 % (ref 11.5–15.5)
WBC: 5.8 10*3/uL (ref 4.0–10.5)

## 2022-05-27 LAB — HEPATIC FUNCTION PANEL
ALT: 19 U/L (ref 0–35)
AST: 22 U/L (ref 0–37)
Albumin: 4.3 g/dL (ref 3.5–5.2)
Alkaline Phosphatase: 54 U/L (ref 39–117)
Bilirubin, Direct: 0.1 mg/dL (ref 0.0–0.3)
Total Bilirubin: 0.5 mg/dL (ref 0.2–1.2)
Total Protein: 7.1 g/dL (ref 6.0–8.3)

## 2022-05-27 LAB — TSH: TSH: 1.49 u[IU]/mL (ref 0.35–5.50)

## 2022-05-27 LAB — VITAMIN D 25 HYDROXY (VIT D DEFICIENCY, FRACTURES): VITD: 54.34 ng/mL (ref 30.00–100.00)

## 2022-05-27 LAB — POCT URINALYSIS DIPSTICK
Bilirubin, UA: NEGATIVE
Glucose, UA: NEGATIVE
Leukocytes, UA: NEGATIVE
Protein, UA: NEGATIVE
Spec Grav, UA: 1.015 (ref 1.010–1.025)
Urobilinogen, UA: 0.2 E.U./dL
pH, UA: 7 (ref 5.0–8.0)

## 2022-05-27 LAB — BASIC METABOLIC PANEL
BUN: 15 mg/dL (ref 6–23)
CO2: 29 mEq/L (ref 19–32)
Calcium: 10 mg/dL (ref 8.4–10.5)
Chloride: 103 mEq/L (ref 96–112)
Creatinine, Ser: 0.79 mg/dL (ref 0.40–1.20)
GFR: 86.55 mL/min (ref >60.00)
Glucose, Bld: 84 mg/dL (ref 70–99)
Potassium: 4.8 mEq/L (ref 3.5–5.1)
Sodium: 140 mEq/L (ref 135–145)

## 2022-05-27 LAB — LIPID PANEL
Cholesterol: 203 mg/dL — ABNORMAL HIGH (ref 0–200)
HDL: 72.1 mg/dL (ref >39.00)
LDL Cholesterol: 117 mg/dL — ABNORMAL HIGH (ref 0–99)
NonHDL: 130.68
Total CHOL/HDL Ratio: 3
Triglycerides: 67 mg/dL (ref 0.0–149.0)
VLDL: 13.4 mg/dL (ref 0.0–40.0)

## 2022-05-27 NOTE — Telephone Encounter (Signed)
Informed pt of lab results  

## 2022-05-27 NOTE — Progress Notes (Signed)
   Subjective:    Patient ID: Natalie Jones, female    DOB: [birth year], 52 y.o.   MRN: [id number]  HPI CPE- UTD on pap, mammo, colonoscopy, Tdap, flu  Patient Care Team    Relationship Specialty Notifications Start End  Midge Minium, MD PCP - General Family Medicine  05/03/16   Key, Nelia Shi, NP Nurse Practitioner Gynecology  08/19/17     Health Maintenance  Topic Date Due   PAP SMEAR-Modifier  11/27/2022   MAMMOGRAM  04/15/2023   COLONOSCOPY (Pts 45-58yr Insurance coverage will need to be confirmed)  08/05/2025   DTaP/Tdap/Td (2 - Td or Tdap) 08/20/2027   INFLUENZA VACCINE  Completed   Hepatitis C Screening  Completed   HIV Screening  Completed   Zoster Vaccines- Shingrix  Completed   HPV VACCINES  Aged Out   COVID-19 Vaccine  Discontinued      Review of Systems Patient reports no vision/ hearing changes, adenopathy,fever, weight change,  persistant/recurrent hoarseness , swallowing issues, chest pain, palpitations, edema, persistant/recurrent cough, hemoptysis, dyspnea (rest/exertional/paroxysmal nocturnal), gastrointestinal bleeding (melena, rectal bleeding), abdominal pain, significant heartburn, bowel changes, Gyn symptoms (abnormal  bleeding, pain),  syncope, focal weakness, memory loss, skin/hair/nail changes, abnormal bruising or bleeding, anxiety, or depression.   + urinary frequency- sxs started ~3 weeks ago.  Noticed an odor.  No burning w/ urination.  + numbness of hands bilaterally upon waking, improved w/ B12 supplements.    Objective:   Physical Exam General Appearance:    Alert, cooperative, no distress, appears stated age  Head:    Normocephalic, without obvious abnormality, atraumatic  Eyes:    PERRL, conjunctiva/corneas clear, EOM's intact both eyes  Ears:    Normal TM's and external ear canals, both ears  Nose:   Nares normal, septum midline, mucosa normal, no drainage    or sinus tenderness  Throat:   Lips, mucosa, and tongue normal; teeth and gums  normal  Neck:   Supple, symmetrical, trachea midline, no adenopathy;    Thyroid: no enlargement/tenderness/nodules  Back:     Symmetric, no curvature, ROM normal, no CVA tenderness  Lungs:     Clear to auscultation bilaterally, respirations unlabored  Chest Wall:    No tenderness or deformity   Heart:    Regular rate and rhythm, S1 and S2 normal, no murmur, rub   or gallop  Breast Exam:    Deferred to GYN  Abdomen:     Soft, non-tender, bowel sounds active all four quadrants,    no masses, no organomegaly  Genitalia:    Deferred to GYN  Rectal:    Extremities:   Extremities normal, atraumatic, no cyanosis or edema  Pulses:   2+ and symmetric all extremities  Skin:   Skin color, texture, turgor normal, no rashes or lesions  Lymph nodes:   Cervical, supraclavicular, and axillary nodes normal  Neurologic:   CNII-XII intact, normal strength, sensation and reflexes    throughout          Assessment & Plan:

## 2022-05-27 NOTE — Patient Instructions (Signed)
Follow up in 1 year or as needed We'll notify you of your lab results and make any changes if needed Make sure you are eating regularly We'll call to schedule your calcium CT score Call with any questions or concerns Stay Safe!  Stay Healthy!

## 2022-05-27 NOTE — Telephone Encounter (Signed)
-----   Message from Midge Minium, MD sent at 05/27/2022  3:34 PM EST ----- Labs look great!  No changes at this time

## 2022-05-27 NOTE — Assessment & Plan Note (Signed)
Ongoing issue.  Stressed need for smoking cessation- particularly in the setting of family hx of CAD and aneurysm.  She is interested in Calcium score and is aware that this is an out of pocket expense.  Test ordered.

## 2022-05-27 NOTE — Assessment & Plan Note (Signed)
Pt's PE WNL w/ exception of low BMI.  UTD on pap, mammo, colonoscopy, Tdap, flu.  Check labs.  Anticipatory guidance provided.

## 2022-05-27 NOTE — Assessment & Plan Note (Signed)
Check labs and replete prn. 

## 2022-05-27 NOTE — Assessment & Plan Note (Signed)
Check labs and start statin if appropriate

## 2022-05-29 ENCOUNTER — Encounter: Payer: Self-pay | Admitting: Family Medicine

## 2022-05-29 LAB — URINE CULTURE
MICRO NUMBER:: 14539521
SPECIMEN QUALITY:: ADEQUATE

## 2022-05-31 ENCOUNTER — Other Ambulatory Visit: Payer: Self-pay

## 2022-05-31 ENCOUNTER — Telehealth: Payer: Self-pay

## 2022-05-31 DIAGNOSIS — N390 Urinary tract infection, site not specified: Secondary | ICD-10-CM

## 2022-05-31 MED ORDER — CEPHALEXIN 500 MG PO CAPS: 500.0000 mg | ORAL_CAPSULE | Freq: Two times a day (BID) | ORAL | 0 refills | Status: AC

## 2022-05-31 NOTE — Telephone Encounter (Signed)
Informed pt of urine results .Keflex 500 mg BID # 10 sent to pharmacy and pt is asking if she can come in for another urine culture once medication is complete

## 2022-05-31 NOTE — Telephone Encounter (Signed)
She can return in 1 week for a lab visit for urine culture (dx uti)

## 2022-05-31 NOTE — Telephone Encounter (Signed)
LM to inform the patient

## 2022-05-31 NOTE — Telephone Encounter (Signed)
-----   Message from Midge Minium, MD sent at 05/31/2022  7:39 AM EST ----- Your urine shows infection.  Based on this, we will start Keflex '500mg'$  twice daily x5 days (#10, no refills)

## 2022-06-07 ENCOUNTER — Other Ambulatory Visit: Payer: BC Managed Care – PPO

## 2022-06-07 ENCOUNTER — Other Ambulatory Visit: Payer: Self-pay

## 2022-06-07 DIAGNOSIS — N390 Urinary tract infection, site not specified: Secondary | ICD-10-CM | POA: Diagnosis not present

## 2022-06-08 LAB — URINE CULTURE
MICRO NUMBER:: 14583519
Result:: NO GROWTH
SPECIMEN QUALITY:: ADEQUATE

## 2022-06-09 ENCOUNTER — Telehealth: Payer: Self-pay

## 2022-06-09 NOTE — Telephone Encounter (Signed)
Left lab results on pt VM

## 2022-06-09 NOTE — Telephone Encounter (Signed)
-----   Message from Midge Minium, MD sent at 06/09/2022  7:16 AM EST ----- No evidence of UTI.  Please let me know how you are feeling

## 2022-06-17 DIAGNOSIS — Z01419 Encounter for gynecological examination (general) (routine) without abnormal findings: Secondary | ICD-10-CM | POA: Diagnosis not present

## 2022-06-17 DIAGNOSIS — Z1231 Encounter for screening mammogram for malignant neoplasm of breast: Secondary | ICD-10-CM | POA: Diagnosis not present

## 2022-06-17 DIAGNOSIS — Z1389 Encounter for screening for other disorder: Secondary | ICD-10-CM | POA: Diagnosis not present

## 2022-06-17 DIAGNOSIS — Z780 Asymptomatic menopausal state: Secondary | ICD-10-CM | POA: Diagnosis not present

## 2022-07-02 ENCOUNTER — Ambulatory Visit (HOSPITAL_BASED_OUTPATIENT_CLINIC_OR_DEPARTMENT_OTHER)
Admission: RE | Admit: 2022-07-02 | Discharge: 2022-07-02 | Disposition: A | Discharge status: Home / Self Care | Payer: BC Managed Care – PPO | Source: Ambulatory Visit | Attending: Family Medicine | Admitting: Family Medicine

## 2022-07-02 ENCOUNTER — Telehealth: Payer: Self-pay

## 2022-07-02 DIAGNOSIS — Z720 Tobacco use: Secondary | ICD-10-CM | POA: Insufficient documentation

## 2022-07-02 DIAGNOSIS — Z8249 Family history of ischemic heart disease and other diseases of the circulatory system: Secondary | ICD-10-CM | POA: Insufficient documentation

## 2022-07-02 DIAGNOSIS — E785 Hyperlipidemia, unspecified: Secondary | ICD-10-CM | POA: Insufficient documentation

## 2022-07-02 NOTE — Telephone Encounter (Signed)
-----   Message from Midge Minium, MD sent at 07/02/2022 11:48 AM EDT ----- Your calcium score is ZERO!  It doesn't get any better than that!!

## 2022-07-02 NOTE — Telephone Encounter (Signed)
Informed pt of lab results  

## 2022-07-29 DIAGNOSIS — F900 Attention-deficit hyperactivity disorder, predominantly inattentive type: Secondary | ICD-10-CM | POA: Diagnosis not present

## 2023-01-28 DIAGNOSIS — F900 Attention-deficit hyperactivity disorder, predominantly inattentive type: Secondary | ICD-10-CM | POA: Diagnosis not present

## 2023-06-02 ENCOUNTER — Encounter: Payer: Self-pay | Admitting: Family Medicine

## 2023-06-02 ENCOUNTER — Telehealth: Payer: Self-pay

## 2023-06-02 ENCOUNTER — Ambulatory Visit: Payer: Self-pay | Admitting: Family Medicine

## 2023-06-02 ENCOUNTER — Ambulatory Visit: Payer: BC Managed Care – PPO | Admitting: Family Medicine

## 2023-06-02 VITALS — BP 110/64 | HR 98 | Temp 97.8°F | Ht 63.0 in | Wt 107.5 lb

## 2023-06-02 DIAGNOSIS — Z23 Encounter for immunization: Secondary | ICD-10-CM | POA: Diagnosis not present

## 2023-06-02 DIAGNOSIS — E673 Hypervitaminosis D: Secondary | ICD-10-CM

## 2023-06-02 DIAGNOSIS — E559 Vitamin D deficiency, unspecified: Secondary | ICD-10-CM | POA: Diagnosis not present

## 2023-06-02 DIAGNOSIS — E785 Hyperlipidemia, unspecified: Secondary | ICD-10-CM

## 2023-06-02 DIAGNOSIS — Z0000 Encounter for general adult medical examination without abnormal findings: Secondary | ICD-10-CM

## 2023-06-02 LAB — CBC WITH DIFFERENTIAL/PLATELET
Basophils Absolute: 0 10*3/uL (ref 0.0–0.1)
Basophils Relative: 0.7 % (ref 0.0–3.0)
Eosinophils Absolute: 0.5 10*3/uL (ref 0.0–0.7)
Eosinophils Relative: 8.6 % — ABNORMAL HIGH (ref 0.0–5.0)
HCT: 38 % (ref 36.0–46.0)
Hemoglobin: 12.4 g/dL (ref 12.0–15.0)
Lymphocytes Relative: 37.1 % (ref 12.0–46.0)
Lymphs Abs: 2.1 10*3/uL (ref 0.7–4.0)
MCHC: 32.7 g/dL (ref 30.0–36.0)
MCV: 84.2 fL (ref 78.0–100.0)
Monocytes Absolute: 0.5 10*3/uL (ref 0.1–1.0)
Monocytes Relative: 9.4 % (ref 3.0–12.0)
Neutro Abs: 2.5 10*3/uL (ref 1.4–7.7)
Neutrophils Relative %: 44.2 % (ref 43.0–77.0)
Platelets: 219 10*3/uL (ref 150.0–400.0)
RBC: 4.52 Mil/uL (ref 3.87–5.11)
RDW: 13.4 % (ref 11.5–15.5)
WBC: 5.6 10*3/uL (ref 4.0–10.5)

## 2023-06-02 LAB — HEPATIC FUNCTION PANEL
ALT: 20 U/L (ref 0–35)
AST: 27 U/L (ref 0–37)
Albumin: 4.3 g/dL (ref 3.5–5.2)
Alkaline Phosphatase: 55 U/L (ref 39–117)
Bilirubin, Direct: 0.1 mg/dL (ref 0.0–0.3)
Total Bilirubin: 0.4 mg/dL (ref 0.2–1.2)
Total Protein: 7.2 g/dL (ref 6.0–8.3)

## 2023-06-02 LAB — LIPID PANEL
Cholesterol: 189 mg/dL (ref 0–200)
HDL: 70.5 mg/dL (ref >39.00)
LDL Cholesterol: 106 mg/dL — ABNORMAL HIGH (ref 0–99)
NonHDL: 118.43
Total CHOL/HDL Ratio: 3
Triglycerides: 61 mg/dL (ref 0.0–149.0)
VLDL: 12.2 mg/dL (ref 0.0–40.0)

## 2023-06-02 LAB — BASIC METABOLIC PANEL
BUN: 9 mg/dL (ref 6–23)
CO2: 30 meq/L (ref 19–32)
Calcium: 9.6 mg/dL (ref 8.4–10.5)
Chloride: 102 meq/L (ref 96–112)
Creatinine, Ser: 0.77 mg/dL (ref 0.40–1.20)
GFR: 88.62 mL/min (ref >60.00)
Glucose, Bld: 79 mg/dL (ref 70–99)
Potassium: 4.6 meq/L (ref 3.5–5.1)
Sodium: 139 meq/L (ref 135–145)

## 2023-06-02 LAB — VITAMIN D 25 HYDROXY (VIT D DEFICIENCY, FRACTURES): VITD: 103.61 ng/mL (ref 30.00–100.00)

## 2023-06-02 LAB — TSH: TSH: 1.3 u[IU]/mL (ref 0.35–5.50)

## 2023-06-02 NOTE — Patient Instructions (Signed)
Follow up in 1 year or as needed We'll notify you of your lab results and make any changes if needed Keep up the good work on healthy diet and regular exercise- you look great!!! Call with any questions or concerns Stay Safe!  Stay Healthy! Happy Valentine's Day!!!

## 2023-06-02 NOTE — Progress Notes (Signed)
   Subjective:    Patient ID: Natalie Jones, female    DOB: 04-05-1971, 53 y.o.   MRN: [id number]  HPI CPE- appt for pap and mammo pending.  UTD on colonoscopy, Tdap  Patient Care Team    Relationship Specialty Notifications Start End  Sheliah Hatch, MD PCP - General Family Medicine  05/03/16   Key, Verita Schneiders, NP Nurse Practitioner Gynecology  08/19/17     Health Maintenance  Topic Date Due   Pneumococcal Vaccine 47-9 Years old (2 of 2 - PCV) 01/16/2020   INFLUENZA VACCINE  11/18/2022   Cervical Cancer Screening (HPV/Pap Cotest)  11/27/2022   COVID-19 Vaccine (5 - 2024-25 season) 12/19/2022   MAMMOGRAM  04/15/2023   Colonoscopy  08/05/2025   DTaP/Tdap/Td (2 - Td or Tdap) 08/20/2027   Hepatitis C Screening  Completed   HIV Screening  Completed   Zoster Vaccines- Shingrix  Completed   HPV VACCINES  Aged Out      Review of Systems Patient reports no vision/ hearing changes, adenopathy,fever, weight change,  persistant/recurrent hoarseness , swallowing issues, chest pain, palpitations, edema, persistant/recurrent cough, hemoptysis, dyspnea (rest/exertional/paroxysmal nocturnal), gastrointestinal bleeding (melena, rectal bleeding), abdominal pain, significant heartburn, bowel changes, GU symptoms (dysuria, hematuria, incontinence), Gyn symptoms (abnormal  bleeding, pain),  syncope, focal weakness, memory loss, numbness & tingling, skin/hair/nail changes, abnormal bruising or bleeding, anxiety, or depression.     Objective:   Physical Exam General Appearance:    Alert, cooperative, no distress, appears stated age  Head:    Normocephalic, without obvious abnormality, atraumatic  Eyes:    PERRL, conjunctiva/corneas clear, EOM's intact both eyes  Ears:    Normal TM's and external ear canals, both ears  Nose:   Nares normal, septum midline, mucosa normal, no drainage    or sinus tenderness  Throat:   Lips, mucosa, and tongue normal; teeth and gums normal  Neck:   Supple, symmetrical,  trachea midline, no adenopathy;    Thyroid: no enlargement/tenderness/nodules  Back:     Symmetric, no curvature, ROM normal, no CVA tenderness  Lungs:     Clear to auscultation bilaterally, respirations unlabored  Chest Wall:    No tenderness or deformity   Heart:    Regular rate and rhythm, S1 and S2 normal, no murmur, rub   or gallop  Breast Exam:    Deferred to GYN  Abdomen:     Soft, non-tender, bowel sounds active all four quadrants,    no masses, no organomegaly  Genitalia:    Deferred to GYN  Rectal:    Extremities:   Extremities normal, atraumatic, no cyanosis or edema  Pulses:   2+ and symmetric all extremities  Skin:   Skin color, texture, turgor normal, no rashes or lesions  Lymph nodes:   Cervical, supraclavicular, and axillary nodes normal  Neurologic:   CNII-XII intact, normal strength, sensation and reflexes    throughout          Assessment & Plan:

## 2023-06-02 NOTE — Telephone Encounter (Signed)
E2C2 nurse Alcario Drought called and gave a critical for this patient. Vitamin D level of 103.61. Provider was made aware

## 2023-06-02 NOTE — Telephone Encounter (Signed)
Address via lab results

## 2023-06-02 NOTE — Telephone Encounter (Signed)
Noted.  Addressed via lab results

## 2023-06-02 NOTE — Assessment & Plan Note (Signed)
Pt's PE WNL.  UTD on colonoscopy, Tdap.  PNA given today.  Appt for pap and mammo scheduled.  Check labs.  Anticipatory guidance provided.

## 2023-06-02 NOTE — Telephone Encounter (Signed)
This RN received critical lab results from [facility] at [facility]. Critical results = Vitamin D level 103.61. This RN reported to [place] at [employer] via [employer].    Copied from CRM [id number]. Topic: Clinical - Red Word Triage >> Jun 02, 2023  2:24 PM Sonny Dandy B wrote: Red Word that prompted transfer to Nurse Triage: Lab calling with Critical lab result. Reason for Disposition  Doctor (or NP/PA) call to PCP  Answer Assessment - Initial Assessment Questions 1. REASON FOR CALL or QUESTION: "What is your reason for calling today?" or "How can I best help you?" or "What question do you have that I can help answer?"     Critical Vitamin D level 103.61 2. CALLER: Document the source of call. (e.g., laboratory, patient).     Lab  Protocols used: PCP Call - No Triage-A-AH

## 2023-06-02 NOTE — Telephone Encounter (Signed)
Critical Vitamin D level, call came from main lab, please advise

## 2023-06-06 NOTE — Telephone Encounter (Signed)
Patient did online reading and is concerned this is life threatening please advise

## 2023-06-09 ENCOUNTER — Telehealth: Payer: Self-pay | Admitting: Family Medicine

## 2023-06-09 NOTE — Telephone Encounter (Signed)
Pt called stating she a has a question about the voicemail that was left.

## 2023-06-09 NOTE — Telephone Encounter (Signed)
Type of form received:Physicians Results Form   Additional comments:   Received QM:VHQION -Front Desk   Form should be Faxed: [phone]  Is patient requesting call for pickup:N/A  Form placed: [facility] charge sheet.  Provider will determine charge.N/A  Individual made aware of 3-5 business day turn around Yes?

## 2023-06-09 NOTE — Telephone Encounter (Signed)
 See previous note

## 2023-06-09 NOTE — Telephone Encounter (Signed)
 Pt has been notified.

## 2023-06-09 NOTE — Telephone Encounter (Signed)
 Form completed and returned to [place]

## 2023-06-09 NOTE — Telephone Encounter (Signed)
Placed form in folder at nurse station

## 2023-06-10 ENCOUNTER — Telehealth: Payer: Self-pay | Admitting: Family Medicine

## 2023-06-10 NOTE — Telephone Encounter (Signed)
Copied from CRM [id number]. Topic: General - Other >> Jun 09, 2023  4:02 PM Pascal Lux wrote: Reason for CRM: Patient called stated she received a voicemail from [place] and returning call to answer whatever question she may have had. I advised per notes form was faxed but patient wants to make sure Tonya did not have a question for her. I advised Archie Patten will follow up on MyChart or may call.

## 2023-06-10 NOTE — Telephone Encounter (Signed)
This must have been an old VM as Tonya did connect with the patient yesterday

## 2023-06-16 ENCOUNTER — Other Ambulatory Visit (INDEPENDENT_AMBULATORY_CARE_PROVIDER_SITE_OTHER): Payer: BC Managed Care – PPO

## 2023-06-16 DIAGNOSIS — E673 Hypervitaminosis D: Secondary | ICD-10-CM | POA: Diagnosis not present

## 2023-06-16 LAB — VITAMIN D 25 HYDROXY (VIT D DEFICIENCY, FRACTURES): VITD: 82.63 ng/mL (ref 30.00–100.00)

## 2023-06-17 ENCOUNTER — Encounter: Payer: Self-pay | Admitting: Family Medicine

## 2023-06-17 NOTE — Telephone Encounter (Signed)
 Patient is wondering if she can start her multivitamin and Vitamin K2 with vitamin D3 again? Labs for Vitamin D were normal.

## 2023-06-17 NOTE — Telephone Encounter (Signed)
 Pt also asking about K2 and D3 combo supplement pill. Please advise

## 2023-06-22 DIAGNOSIS — L570 Actinic keratosis: Secondary | ICD-10-CM | POA: Diagnosis not present

## 2023-06-22 DIAGNOSIS — L814 Other melanin hyperpigmentation: Secondary | ICD-10-CM | POA: Diagnosis not present

## 2023-06-22 DIAGNOSIS — L820 Inflamed seborrheic keratosis: Secondary | ICD-10-CM | POA: Diagnosis not present

## 2023-06-22 DIAGNOSIS — L821 Other seborrheic keratosis: Secondary | ICD-10-CM | POA: Diagnosis not present

## 2023-06-30 DIAGNOSIS — Z01419 Encounter for gynecological examination (general) (routine) without abnormal findings: Secondary | ICD-10-CM | POA: Diagnosis not present

## 2023-06-30 DIAGNOSIS — Z130 Encounter for screening for diseases of the blood and blood-forming organs and certain disorders involving the immune mechanism: Secondary | ICD-10-CM | POA: Diagnosis not present

## 2023-06-30 DIAGNOSIS — Z1231 Encounter for screening mammogram for malignant neoplasm of breast: Secondary | ICD-10-CM | POA: Diagnosis not present

## 2023-06-30 LAB — HM MAMMOGRAPHY

## 2023-07-04 ENCOUNTER — Ambulatory Visit: Payer: Self-pay | Admitting: Family Medicine

## 2023-07-04 ENCOUNTER — Ambulatory Visit: Admitting: Family Medicine

## 2023-07-04 ENCOUNTER — Encounter: Payer: Self-pay | Admitting: Family Medicine

## 2023-07-04 VITALS — BP 88/50 | HR 102 | Temp 98.0°F | Ht 63.0 in | Wt 106.4 lb

## 2023-07-04 DIAGNOSIS — M545 Low back pain, unspecified: Secondary | ICD-10-CM | POA: Diagnosis not present

## 2023-07-04 DIAGNOSIS — R5383 Other fatigue: Secondary | ICD-10-CM | POA: Diagnosis not present

## 2023-07-04 LAB — BASIC METABOLIC PANEL
BUN: 11 mg/dL (ref 6–23)
CO2: 29 meq/L (ref 19–32)
Calcium: 9 mg/dL (ref 8.4–10.5)
Chloride: 102 meq/L (ref 96–112)
Creatinine, Ser: 0.66 mg/dL (ref 0.40–1.20)
GFR: 100.72 mL/min (ref >60.00)
Glucose, Bld: 101 mg/dL — ABNORMAL HIGH (ref 70–99)
Potassium: 4 meq/L (ref 3.5–5.1)
Sodium: 137 meq/L (ref 135–145)

## 2023-07-04 LAB — POCT URINALYSIS DIPSTICK
Bilirubin, UA: NEGATIVE
Blood, UA: NEGATIVE
Glucose, UA: NEGATIVE
Ketones, UA: NEGATIVE
Nitrite, UA: NEGATIVE
Protein, UA: NEGATIVE
Spec Grav, UA: 1.015 (ref 1.010–1.025)
Urobilinogen, UA: 0.2 U/dL
pH, UA: 6 (ref 5.0–8.0)

## 2023-07-04 LAB — HEPATIC FUNCTION PANEL
ALT: 23 U/L (ref 0–35)
AST: 27 U/L (ref 0–37)
Albumin: 4.1 g/dL (ref 3.5–5.2)
Alkaline Phosphatase: 64 U/L (ref 39–117)
Bilirubin, Direct: 0.1 mg/dL (ref 0.0–0.3)
Total Bilirubin: 0.4 mg/dL (ref 0.2–1.2)
Total Protein: 6.7 g/dL (ref 6.0–8.3)

## 2023-07-04 LAB — CBC WITH DIFFERENTIAL/PLATELET
Basophils Absolute: 0 10*3/uL (ref 0.0–0.1)
Basophils Relative: 0.1 % (ref 0.0–3.0)
Eosinophils Absolute: 0.1 10*3/uL (ref 0.0–0.7)
Eosinophils Relative: 2 % (ref 0.0–5.0)
HCT: 38.4 % (ref 36.0–46.0)
Hemoglobin: 12.5 g/dL (ref 12.0–15.0)
Lymphocytes Relative: 13.3 % (ref 12.0–46.0)
Lymphs Abs: 0.7 10*3/uL (ref 0.7–4.0)
MCHC: 32.6 g/dL (ref 30.0–36.0)
MCV: 84.8 fl (ref 78.0–100.0)
Monocytes Absolute: 0.4 10*3/uL (ref 0.1–1.0)
Monocytes Relative: 6.9 % (ref 3.0–12.0)
Neutro Abs: 4 10*3/uL (ref 1.4–7.7)
Neutrophils Relative %: 77.7 % — ABNORMAL HIGH (ref 43.0–77.0)
Platelets: 195 10*3/uL (ref 150.0–400.0)
RBC: 4.52 Mil/uL (ref 3.87–5.11)
RDW: 13.4 % (ref 11.5–15.5)
WBC: 5.1 10*3/uL (ref 4.0–10.5)

## 2023-07-04 LAB — POC INFLUENZA A&B (BINAX/QUICKVUE)
Influenza A, POC: NEGATIVE
Influenza B, POC: NEGATIVE

## 2023-07-04 LAB — TSH: TSH: 0.45 u[IU]/mL (ref 0.35–5.50)

## 2023-07-04 MED ORDER — ONDANSETRON HCL 4 MG PO TABS: 4.0000 mg | ORAL_TABLET | Freq: Three times a day (TID) | ORAL | 0 refills | Status: AC | PRN

## 2023-07-04 NOTE — Patient Instructions (Signed)
 Follow up as needed or as scheduled We'll notify you of your lab results and make any changes if needed INCREASE your water intake!! USE the Ondansetron (Zofran) as needed for nausea Call with any questions or concerns Stay Safe!  Stay Healthy! Hang in there!!!

## 2023-07-04 NOTE — Telephone Encounter (Signed)
 Lower back pain (7-8/10) started last night Symptoms: Nauseous, fatigue, headache (5/10) since this morning Pertinent Negatives: Patient denies pain radiation, weakness, numbness, loss of bowel/bladder control  Disposition:  [x] Appointment(In office)  Additional Notes: Pt states yesterday afternoon she started feeling nauseous and her back started hurting. Pt states she got a headache today. Pt scheduled for an appt in office with PCP today. This RN educated pt on home care, new-worsening symptoms, when to call back/seek emergent care. Pt verbalized understanding and agrees to plan.    Copied from CRM [id number]. Topic: Clinical - Red Word Triage >> Jul 04, 2023 10:26 AM Fonda Kinder J wrote: Red Word that prompted transfer to Nurse Triage: Severe pain in back, headache,nausea Reason for Disposition  [1] SEVERE back pain (e.g., excruciating, unable to do any normal activities) AND [2] not improved 2 hours after pain medicine  Answer Assessment - Initial Assessment Questions Lower back pain, started last night (7-8/10 pain level), constant Nauseous, fatigue, headache (5/10) Denies radiation of pain, weakness, numbness, loss of bowel/bladder control Not sure cause Pt does strenuous work  Protocols used: Back Pain-A-AH

## 2023-07-04 NOTE — Telephone Encounter (Signed)
Seeing in office today 

## 2023-07-04 NOTE — Progress Notes (Unsigned)
   Subjective:    Patient ID: Natalie Jones, female    DOB: Dec 31, 1970, 53 y.o.   MRN: [id number]  HPI Back pain- pt reports she was nauseous yesterday.  Developed back pain.  Took OTC nausea medication and today feels somewhat better.  Ibuprofen helped w/ back.  No burning w/ urination, no frequency.  No urine odor or change in color.  No suprapubic pain/pressure.  No fever.  Home COVID test was negative.  Pt's BP is low today but she has not been eating or drinking due to nausea.  No diarrhea.   Review of Systems For ROS see HPI     Objective:   Physical Exam Vitals reviewed.  Constitutional:      General: She is not in acute distress.    Appearance: Normal appearance. She is well-developed. She is not ill-appearing.  HENT:     Head: Normocephalic and atraumatic.  Eyes:     Conjunctiva/sclera: Conjunctivae normal.     Pupils: Pupils are equal, round, and reactive to light.  Neck:     Thyroid: No thyromegaly.  Cardiovascular:     Rate and Rhythm: Normal rate and regular rhythm.     Heart sounds: Normal heart sounds. No murmur heard. Pulmonary:     Effort: Pulmonary effort is normal. No respiratory distress.     Breath sounds: Normal breath sounds.  Abdominal:     General: There is no distension.     Palpations: Abdomen is soft.     Tenderness: There is no abdominal tenderness. There is no right CVA tenderness, left CVA tenderness, guarding or rebound.  Musculoskeletal:     Cervical back: Normal range of motion and neck supple.     Right lower leg: No edema.     Left lower leg: No edema.  Lymphadenopathy:     Cervical: No cervical adenopathy.  Skin:    General: Skin is warm and dry.  Neurological:     General: No focal deficit present.     Mental Status: She is alert and oriented to person, place, and time.  Psychiatric:        Mood and Affect: Mood normal.        Behavior: Behavior normal.        Thought Content: Thought content normal.           Assessment & Plan:   Acute LBP- new.  Pt reports sxs were severe yesterday but have improved today.  Ibuprofen helped w/ pain.  Denies urinary sxs so will await urine culture results before treating presence of leuks.  Given her nausea and fatigue, this could be a viral illness.  Continue NSAIDs prn.  Will treat urine if culture result is +.  Reviewed supportive care and red flags that should prompt return.  Pt expressed understanding and is in agreement w/ plan.   Fatigue- new.  Pt reports sxs are severe.  She denies fever but admits that she has not been eating or drinking due to nausea.  BP is lower than usual- but this may be due to dehydration.  She denies dizziness.  Encouraged fluids.  Zofran provided to use prn.  Check labs to r/o metabolic causes of fatigue.  Reviewed supportive care and red flags that should prompt return.  Pt expressed understanding and is in agreement w/ plan.

## 2023-07-05 ENCOUNTER — Encounter: Payer: Self-pay | Admitting: Family Medicine

## 2023-07-05 LAB — URINE CULTURE
MICRO NUMBER:: 16209690
Result:: NO GROWTH
SPECIMEN QUALITY:: ADEQUATE

## 2023-07-05 NOTE — Telephone Encounter (Signed)
 Questions on lab results for nausea and back pain. Please advise

## 2023-07-06 NOTE — Telephone Encounter (Signed)
 Called patient to relay lab results, Left VM to return call

## 2023-07-06 NOTE — Telephone Encounter (Signed)
-----   Message from Neena Rhymes sent at 07/06/2023  7:38 AM EDT ----- No evidence of UTI- great news!  You could have had a viral illness, possibly a small kidney stone (pain and nausea are very common but often there is associated bloody urine).  We may not know for certain the cause, but I hope you are feeling better!!

## 2023-07-07 NOTE — Telephone Encounter (Signed)
 Patient questioning lab results please advise

## 2023-07-13 ENCOUNTER — Encounter: Payer: Self-pay | Admitting: Family Medicine

## 2023-07-22 DIAGNOSIS — F900 Attention-deficit hyperactivity disorder, predominantly inattentive type: Secondary | ICD-10-CM | POA: Diagnosis not present

## 2023-09-07 DIAGNOSIS — L821 Other seborrheic keratosis: Secondary | ICD-10-CM | POA: Diagnosis not present

## 2023-09-07 DIAGNOSIS — X32XXXS Exposure to sunlight, sequela: Secondary | ICD-10-CM | POA: Diagnosis not present

## 2023-09-07 DIAGNOSIS — L814 Other melanin hyperpigmentation: Secondary | ICD-10-CM | POA: Diagnosis not present

## 2023-09-07 DIAGNOSIS — L853 Xerosis cutis: Secondary | ICD-10-CM | POA: Diagnosis not present

## 2023-11-14 ENCOUNTER — Ambulatory Visit: Payer: Self-pay | Admitting: Family Medicine

## 2023-11-14 ENCOUNTER — Telehealth: Admitting: Family Medicine

## 2023-11-14 NOTE — Telephone Encounter (Signed)
 Scheduled patient with Dr. Levora at 4:20pm mychart visit.

## 2023-11-14 NOTE — Telephone Encounter (Signed)
 Patient refused appointment. Patient is COVID positive and is requesting a prescription for sx.

## 2023-11-14 NOTE — Telephone Encounter (Signed)
 Patient is wanting to cxl appointment today.   Copied from CRM [id number]. Topic: Appointments - Appointment Cancel/Reschedule >> Nov 14, 2023  3:36 PM [place] M wrote: Patient/patient representative is calling to cancel or reschedule an appointment. Refer to attachments for appointment information.   Patient called and wanted to cancel appt for today 7/28. Said she didn't need to come.

## 2023-11-14 NOTE — Telephone Encounter (Signed)
 FYI Only or Action Required?: Action required by provider: prescription request.  Patient was last seen in primary care on 07/04/2023 by Mahlon Comer BRAVO, MD.  Called Nurse Triage reporting Covid Positive.  Symptoms began a week ago.  Symptoms are: unchanged.  Triage Disposition: See Physician Within 24 Hours  Patient/caregiver understands and will follow disposition?: No, wishes to speak with PCP      Copied from CRM #[id number]. Topic: Clinical - Red Word Triage >> Nov 14, 2023  9:38 AM Charolett L wrote: [facility] that prompted transfer to Nurse Triage: Patient has tested positive for covid Reason for Disposition  SEVERE coughing spells (e.g., whooping sound after coughing, vomiting after coughing)  Answer Assessment - Initial Assessment Questions Patient refused scheduling an appointment at this time and wants a prescription for her symptoms sent to the following pharmacy:    [id number] - [place], [place] - [address]  [address], [place] [id number]     Saturday, 7/26- tested positive for COVID Congestion and cough now Before that headache and sore throat- now gone Fever- 98.4 F    ONSET: When did the cough begin?      One week SPUTUM: Describe the color of your sputum (e.g., none, dry cough; clear, white, yellow, green)     Green, yellow HEMOPTYSIS: Are you coughing up any blood? If Yes, ask: How much? (e.g., flecks, streaks, tablespoons, etc.)     No DIFFICULTY BREATHING: Are you having difficulty breathing? If Yes, ask: How bad is it? (e.g., mild, moderate, severe)      No  Protocols used: Cough - Acute Productive-A-AH

## 2023-12-21 DIAGNOSIS — M791 Myalgia, unspecified site: Secondary | ICD-10-CM | POA: Diagnosis not present

## 2023-12-21 DIAGNOSIS — M542 Cervicalgia: Secondary | ICD-10-CM | POA: Diagnosis not present

## 2023-12-22 DIAGNOSIS — M5412 Radiculopathy, cervical region: Secondary | ICD-10-CM | POA: Diagnosis not present

## 2023-12-24 ENCOUNTER — Emergency Department (HOSPITAL_BASED_OUTPATIENT_CLINIC_OR_DEPARTMENT_OTHER)
Admission: EM | Admit: 2023-12-24 | Discharge: 2023-12-24 | Disposition: A | Discharge status: Home / Self Care | Attending: Emergency Medicine | Admitting: Emergency Medicine

## 2023-12-24 ENCOUNTER — Other Ambulatory Visit: Payer: Self-pay

## 2023-12-24 DIAGNOSIS — M5412 Radiculopathy, cervical region: Secondary | ICD-10-CM | POA: Insufficient documentation

## 2023-12-24 DIAGNOSIS — M542 Cervicalgia: Secondary | ICD-10-CM | POA: Diagnosis not present

## 2023-12-24 MED ORDER — KETOROLAC TROMETHAMINE 10 MG PO TABS: 10.0000 mg | ORAL_TABLET | Freq: Four times a day (QID) | ORAL | 0 refills | Status: AC | PRN

## 2023-12-24 MED ORDER — HYDROCODONE-ACETAMINOPHEN 5-325 MG PO TABS: 1.0000 | ORAL_TABLET | Freq: Four times a day (QID) | ORAL | 0 refills | Status: AC | PRN

## 2023-12-24 MED ORDER — LIDOCAINE 5 % EX PTCH
1.0000 | MEDICATED_PATCH | CUTANEOUS | Status: DC
  Administered 2023-12-24: 1 via TRANSDERMAL
  Filled 2023-12-24: qty 1

## 2023-12-24 MED ORDER — KETOROLAC TROMETHAMINE 60 MG/2ML IM SOLN
30.0000 mg | Freq: Once | INTRAMUSCULAR | Status: AC
  Administered 2023-12-24: 30 mg via INTRAMUSCULAR
  Filled 2023-12-24: qty 2

## 2023-12-24 MED ORDER — CYCLOBENZAPRINE HCL 10 MG PO TABS: 10.0000 mg | ORAL_TABLET | Freq: Two times a day (BID) | ORAL | 0 refills | Status: AC | PRN

## 2023-12-24 MED ORDER — LIDOCAINE 5 % EX PTCH: 1.0000 | MEDICATED_PATCH | CUTANEOUS | 0 refills | Status: AC

## 2023-12-24 NOTE — ED Provider Notes (Signed)
 [place] EMERGENCY DEPARTMENT AT [facility] Provider Note   CSN: [id number] Arrival date & time: 12/24/23  1050     Patient presents with: Arm Pain   Natalie Jones is a 53 y.o. female.    Arm Pain     53 year old female with medical history significant for ADHD, HLD, history of cervical radiculopathy presenting to the emergency department with neck pain radiating to the right shoulder.  The patient states that she denies any falls or trauma.  She has had no fevers.  She was seen outpatient on 9//25, given an oral steroid, muscle relaxant and has been taking Tylenol  without significant relief.  She endorses sharp pain worse with movement and twisting that radiates from her neck down to her shoulder and trapezius muscle.  She endorses muscle spasms.  Prior to Admission medications   Medication Sig Start Date End Date Taking? Authorizing Provider  cyclobenzaprine  (FLEXERIL ) 10 MG tablet Take 1 tablet (10 mg total) by mouth 2 (two) times daily as needed for muscle spasms. 12/24/23  Yes Jerrol Agent, MD  HYDROcodone -acetaminophen  (NORCO/VICODIN) 5-325 MG tablet Take 1-2 tablets by mouth every 6 (six) hours as needed. 12/24/23  Yes Jerrol Agent, MD  ketorolac  (TORADOL ) 10 MG tablet Take 1 tablet (10 mg total) by mouth every 6 (six) hours as needed. 12/24/23  Yes Jerrol Agent, MD  lidocaine  (LIDODERM ) 5 % Place 1 patch onto the skin daily. Remove & Discard patch within 12 hours or as directed by MD 12/24/23  Yes Jerrol Agent, MD  amphetamine -dextroamphetamine  (ADDERALL) 20 MG tablet Take 20 mg by mouth 3 (three) times daily. Takes at 0700, 1200, and 1600.    [provider]  fluticasone  (FLONASE ) 50 MCG/ACT nasal spray SHAKE LIQUID AND USE 2 SPRAYS IN EACH NOSTRIL DAILY 10/14/21   Saguier, [place], PA-C  Multiple Vitamins-Minerals (MULTIVITAL PO) Take by mouth.    [provider]  ondansetron  (ZOFRAN ) 4 MG tablet Take 1 tablet (4 mg total) by mouth every 8 (eight) hours  as needed. 07/04/23   Tabori, Katherine E, MD  VITAMIN D  PO Take by mouth.    [provider]    Allergies: Patient has no known allergies.    Review of Systems  All other systems reviewed and are negative.   Updated Vital Signs BP 127/70   Pulse (!) 109   Temp 98 F (36.7 C) (Temporal)   Resp 16   SpO2 100%   Physical Exam Vitals and nursing note reviewed.  Constitutional:      General: She is not in acute distress. HENT:     Head: Normocephalic and atraumatic.  Eyes:     Conjunctiva/sclera: Conjunctivae normal.     Pupils: Pupils are equal, round, and reactive to light.  Neck:     Comments: No midline tenderness of the cervical spine, passive range of motion of the neck intact without pain, positive Spurling sign Cardiovascular:     Rate and Rhythm: Normal rate and regular rhythm.  Pulmonary:     Effort: Pulmonary effort is normal. No respiratory distress.  Abdominal:     General: There is no distension.     Tenderness: There is no guarding.  Musculoskeletal:        General: No deformity or signs of injury.     Cervical back: Full passive range of motion without pain and neck supple.     Comments: Range of motion of the shoulder intact both actively and passively without pain, distal right upper extremity  neurovascular intact with 2+ pulses, intact motor function along the median, ulnar, radial nerve distributions, no tenderness of the shoulder or AC joint  Skin:    Findings: No lesion or rash.  Neurological:     General: No focal deficit present.     Mental Status: She is alert. Mental status is at baseline.     Comments: No cranial nerve deficit, 5 out of 5 strength in the bilateral upper and lower extremities, intact sensation to light touch     (all labs ordered are listed, but only abnormal results are displayed) Labs Reviewed - No data to display  EKG: None  Radiology: No results found.   Procedures   Medications Ordered in the ED   lidocaine  (LIDODERM ) 5 % 1 patch (1 patch Transdermal Patch Applied 12/24/23 1226)  ketorolac  (TORADOL ) injection 30 mg (30 mg Intramuscular Given 12/24/23 1226)                                    Medical Decision Making Risk Prescription drug management.     53 year old female with medical history significant for ADHD, HLD, history of cervical radiculopathy presenting to the emergency department with neck pain radiating to the right shoulder.  The patient states that she denies any falls or trauma.  She has had no fevers.  She was seen outpatient on 9//25, given an oral steroid, muscle relaxant and has been taking Tylenol  without significant relief.  She endorses sharp pain worse with movement and twisting that radiates from her neck down to her shoulder and trapezius muscle.  She endorses muscle spasms.  On arrival, the patient was afebrile, mildly tachycardic, hemodynamically stable.  On exam the patient had a normal neurologic exam with no numbness or weakness.  She had a positive Spurling sign and endorses radicular symptoms.  Range of motion of the shoulder intact with no particular tenderness, mild trapezius tenderness.  Patient endorses muscle spasms in the setting of radicular pain.  The patient denies any falls or trauma.  She has intact range of motion of the shoulder itself.  With a positive Spurling sign and radicular symptoms, strongly suspect radiculopathy.  Patient has been on a steroid, taking Tylenol , presenting for continued need for symptomatic pain management.  IM Toradol  was administered in addition to a lidocaine  patch.  On repeat assessment, the patient was feeling symptomatically improved.  Very low concern for acute fracture, recent x-ray imaging was negative, do not feel that CT imaging or MRI injury imaging is indicated at this time, low concern for discitis/osteomyelitis, epidural abscess or other acute emergency requiring emergent MRI imaging at this time.  Return  precautions provided in the event of any severe worsening symptoms, worsening weakness or numbness, other red flag symptoms to include saddle anesthesia or urinary or fecal incontinence or new falls or trauma or development of fever with associated neck pain.       Final diagnoses:  Cervical radiculopathy    ED Discharge Orders          Ordered    ketorolac  (TORADOL ) 10 MG tablet  Every 6 hours PRN        12/24/23 1239    lidocaine  (LIDODERM ) 5 %  Every 24 hours        12/24/23 1239    HYDROcodone -acetaminophen  (NORCO/VICODIN) 5-325 MG tablet  Every 6 hours PRN        12/24/23 1239  cyclobenzaprine  (FLEXERIL ) 10 MG tablet  2 times daily PRN        12/24/23 1242               Jerrol Agent, MD 12/24/23 1302

## 2023-12-24 NOTE — ED Triage Notes (Signed)
 Patient states right arm pain and shoulder pain. Has seen orthopedics several times for same. Reports taking steroids and muscle relaxers with no relief.

## 2023-12-24 NOTE — Discharge Instructions (Addendum)
 Please follow up with your spine doctor as well as your primary care provider for continued outpatient pain management.  Return for any neck pain with fever, neck pain with weakness in the arms or legs, numbness in the groin and an area where you sit on a saddle, urinary fecal incontinence, new falls or trauma.  If you have continued chronic symptoms, an outpatient MRI could be beneficial to better characterize.  Do not operate heavy machinery while taking Flexeril  or opiates

## 2023-12-28 DIAGNOSIS — M542 Cervicalgia: Secondary | ICD-10-CM | POA: Diagnosis not present

## 2023-12-28 DIAGNOSIS — M5412 Radiculopathy, cervical region: Secondary | ICD-10-CM | POA: Diagnosis not present

## 2023-12-29 DIAGNOSIS — M5412 Radiculopathy, cervical region: Secondary | ICD-10-CM | POA: Diagnosis not present

## 2023-12-29 DIAGNOSIS — M5030 Other cervical disc degeneration, unspecified cervical region: Secondary | ICD-10-CM | POA: Diagnosis not present

## 2023-12-31 DIAGNOSIS — M5412 Radiculopathy, cervical region: Secondary | ICD-10-CM | POA: Diagnosis not present

## 2024-01-02 DIAGNOSIS — M436 Torticollis: Secondary | ICD-10-CM | POA: Diagnosis not present

## 2024-01-02 DIAGNOSIS — M542 Cervicalgia: Secondary | ICD-10-CM | POA: Diagnosis not present

## 2024-01-04 DIAGNOSIS — M5412 Radiculopathy, cervical region: Secondary | ICD-10-CM | POA: Diagnosis not present

## 2024-01-09 DIAGNOSIS — M436 Torticollis: Secondary | ICD-10-CM | POA: Diagnosis not present

## 2024-01-09 DIAGNOSIS — M542 Cervicalgia: Secondary | ICD-10-CM | POA: Diagnosis not present

## 2024-01-11 DIAGNOSIS — F900 Attention-deficit hyperactivity disorder, predominantly inattentive type: Secondary | ICD-10-CM | POA: Diagnosis not present

## 2024-01-12 ENCOUNTER — Other Ambulatory Visit (HOSPITAL_BASED_OUTPATIENT_CLINIC_OR_DEPARTMENT_OTHER): Payer: Self-pay

## 2024-01-12 DIAGNOSIS — M5412 Radiculopathy, cervical region: Secondary | ICD-10-CM | POA: Diagnosis not present

## 2024-01-12 MED ORDER — AMPHETAMINE-DEXTROAMPHETAMINE 30 MG PO TABS
30.0000 mg | ORAL_TABLET | Freq: Two times a day (BID) | ORAL | 0 refills | Status: AC
  Filled 2024-01-12: qty 60, 30d supply, fill #0

## 2024-01-16 DIAGNOSIS — M542 Cervicalgia: Secondary | ICD-10-CM | POA: Diagnosis not present

## 2024-01-16 DIAGNOSIS — M436 Torticollis: Secondary | ICD-10-CM | POA: Diagnosis not present

## 2024-01-23 DIAGNOSIS — M4722 Other spondylosis with radiculopathy, cervical region: Secondary | ICD-10-CM | POA: Diagnosis not present

## 2024-02-01 DIAGNOSIS — M4722 Other spondylosis with radiculopathy, cervical region: Secondary | ICD-10-CM | POA: Diagnosis not present

## 2024-02-09 DIAGNOSIS — M4722 Other spondylosis with radiculopathy, cervical region: Secondary | ICD-10-CM | POA: Diagnosis not present

## 2024-02-10 ENCOUNTER — Other Ambulatory Visit (HOSPITAL_BASED_OUTPATIENT_CLINIC_OR_DEPARTMENT_OTHER): Payer: Self-pay

## 2024-02-10 MED ORDER — AMPHETAMINE-DEXTROAMPHETAMINE 30 MG PO TABS
30.0000 mg | ORAL_TABLET | Freq: Two times a day (BID) | ORAL | 0 refills | Status: DC
  Filled 2024-02-11: qty 60, 30d supply, fill #0

## 2024-02-11 ENCOUNTER — Other Ambulatory Visit (HOSPITAL_BASED_OUTPATIENT_CLINIC_OR_DEPARTMENT_OTHER): Payer: Self-pay

## 2024-02-29 DIAGNOSIS — M4802 Spinal stenosis, cervical region: Secondary | ICD-10-CM | POA: Diagnosis not present

## 2024-02-29 DIAGNOSIS — M2578 Osteophyte, vertebrae: Secondary | ICD-10-CM | POA: Diagnosis not present

## 2024-02-29 DIAGNOSIS — M4722 Other spondylosis with radiculopathy, cervical region: Secondary | ICD-10-CM | POA: Diagnosis not present

## 2024-03-12 ENCOUNTER — Other Ambulatory Visit (HOSPITAL_BASED_OUTPATIENT_CLINIC_OR_DEPARTMENT_OTHER): Payer: Self-pay

## 2024-03-12 MED ORDER — AMPHETAMINE-DEXTROAMPHETAMINE 30 MG PO TABS
30.0000 mg | ORAL_TABLET | Freq: Two times a day (BID) | ORAL | 0 refills | Status: AC
  Filled 2024-04-11: qty 60, 30d supply, fill #0

## 2024-03-12 MED ORDER — AMPHETAMINE-DEXTROAMPHETAMINE 30 MG PO TABS
30.0000 mg | ORAL_TABLET | Freq: Two times a day (BID) | ORAL | 0 refills | Status: AC
  Filled 2024-03-12: qty 60, 30d supply, fill #0

## 2024-03-12 MED ORDER — AMPHETAMINE-DEXTROAMPHETAMINE 30 MG PO TABS
30.0000 mg | ORAL_TABLET | Freq: Two times a day (BID) | ORAL | 0 refills | Status: AC
  Filled 2024-05-11 (×2): qty 60, 30d supply, fill #0

## 2024-04-10 ENCOUNTER — Other Ambulatory Visit (HOSPITAL_BASED_OUTPATIENT_CLINIC_OR_DEPARTMENT_OTHER): Payer: Self-pay

## 2024-04-11 ENCOUNTER — Other Ambulatory Visit (HOSPITAL_BASED_OUTPATIENT_CLINIC_OR_DEPARTMENT_OTHER): Payer: Self-pay

## 2024-05-10 ENCOUNTER — Other Ambulatory Visit (HOSPITAL_BASED_OUTPATIENT_CLINIC_OR_DEPARTMENT_OTHER): Payer: Self-pay

## 2024-05-11 ENCOUNTER — Other Ambulatory Visit (HOSPITAL_BASED_OUTPATIENT_CLINIC_OR_DEPARTMENT_OTHER): Payer: Self-pay

## 2024-06-04 ENCOUNTER — Encounter: Payer: BC Managed Care – PPO | Admitting: Family Medicine

## 2024-06-13 ENCOUNTER — Encounter: Admitting: Family Medicine
